# Patient Record
Sex: Female | Born: 1975 | Race: White | Hispanic: No | Marital: Single | Smoking: Current every day smoker
Health system: Southern US, Community
[De-identification: ages and names within clinical notes are randomized; demographics above are authoritative.]

## PROBLEM LIST (undated history)

## (undated) DIAGNOSIS — IMO0002 Reserved for concepts with insufficient information to code with codable children: Secondary | ICD-10-CM

## (undated) DIAGNOSIS — C569 Malignant neoplasm of unspecified ovary: Secondary | ICD-10-CM

## (undated) DIAGNOSIS — R011 Cardiac murmur, unspecified: Secondary | ICD-10-CM

## (undated) DIAGNOSIS — F419 Anxiety disorder, unspecified: Secondary | ICD-10-CM

## (undated) DIAGNOSIS — Z8719 Personal history of other diseases of the digestive system: Secondary | ICD-10-CM

## (undated) DIAGNOSIS — N83209 Unspecified ovarian cyst, unspecified side: Secondary | ICD-10-CM

## (undated) DIAGNOSIS — A749 Chlamydial infection, unspecified: Secondary | ICD-10-CM

## (undated) HISTORY — DX: Chlamydial infection, unspecified: A74.9

## (undated) HISTORY — PX: SACROILIAC JOINT FUSION: SHX6088

## (undated) HISTORY — DX: Malignant neoplasm of unspecified ovary: C56.9

## (undated) HISTORY — PX: BACK SURGERY: SHX140

---

## 2006-12-05 ENCOUNTER — Ambulatory Visit: Payer: Self-pay

## 2007-01-06 ENCOUNTER — Observation Stay: Payer: Self-pay | Admitting: Obstetrics & Gynecology

## 2007-01-23 ENCOUNTER — Inpatient Hospital Stay: Payer: Self-pay | Admitting: Obstetrics & Gynecology

## 2007-04-13 ENCOUNTER — Ambulatory Visit: Payer: Self-pay

## 2008-05-15 ENCOUNTER — Ambulatory Visit: Payer: Self-pay | Admitting: Obstetrics & Gynecology

## 2008-05-17 ENCOUNTER — Ambulatory Visit (HOSPITAL_COMMUNITY): Admission: RE | Admit: 2008-05-17 | Discharge: 2008-05-17 | Payer: Self-pay | Admitting: Gynecology

## 2008-06-12 ENCOUNTER — Ambulatory Visit: Payer: Self-pay | Admitting: Obstetrics and Gynecology

## 2008-06-12 ENCOUNTER — Encounter: Payer: Self-pay | Admitting: Obstetrics and Gynecology

## 2008-07-03 ENCOUNTER — Ambulatory Visit (HOSPITAL_COMMUNITY): Admission: RE | Admit: 2008-07-03 | Discharge: 2008-07-03 | Payer: Self-pay | Admitting: Obstetrics & Gynecology

## 2008-07-24 ENCOUNTER — Ambulatory Visit: Payer: Self-pay | Admitting: Obstetrics & Gynecology

## 2008-08-03 ENCOUNTER — Emergency Department: Payer: Self-pay | Admitting: Emergency Medicine

## 2008-08-20 ENCOUNTER — Ambulatory Visit: Payer: Self-pay | Admitting: Obstetrics and Gynecology

## 2008-08-21 ENCOUNTER — Ambulatory Visit (HOSPITAL_COMMUNITY): Admission: RE | Admit: 2008-08-21 | Discharge: 2008-08-21 | Payer: Self-pay | Admitting: Obstetrics & Gynecology

## 2008-09-19 ENCOUNTER — Ambulatory Visit: Payer: Self-pay | Admitting: Obstetrics and Gynecology

## 2008-09-23 ENCOUNTER — Encounter: Payer: Self-pay | Admitting: Obstetrics & Gynecology

## 2008-09-23 ENCOUNTER — Ambulatory Visit: Payer: Self-pay | Admitting: Obstetrics and Gynecology

## 2008-09-23 LAB — CONVERTED CEMR LAB
HCT: 32.1 % — ABNORMAL LOW (ref 36.0–46.0)
MCV: 90.9 fL (ref 78.0–100.0)
RBC: 3.53 M/uL — ABNORMAL LOW (ref 3.87–5.11)
RDW: 13 % (ref 11.5–15.5)
WBC: 9.8 10*3/uL (ref 4.0–10.5)

## 2008-10-02 ENCOUNTER — Ambulatory Visit: Payer: Self-pay | Admitting: Obstetrics and Gynecology

## 2008-10-03 ENCOUNTER — Encounter: Payer: Self-pay | Admitting: Obstetrics & Gynecology

## 2008-10-03 LAB — CONVERTED CEMR LAB
Clue Cells Wet Prep HPF POC: NONE SEEN
Trich, Wet Prep: NONE SEEN

## 2008-10-07 ENCOUNTER — Ambulatory Visit: Payer: Self-pay | Admitting: Family Medicine

## 2008-10-07 ENCOUNTER — Inpatient Hospital Stay (HOSPITAL_COMMUNITY): Admission: AD | Admit: 2008-10-07 | Discharge: 2008-10-10 | Payer: Self-pay | Admitting: Obstetrics & Gynecology

## 2008-10-16 ENCOUNTER — Ambulatory Visit: Payer: Self-pay | Admitting: Family Medicine

## 2008-10-23 ENCOUNTER — Ambulatory Visit: Payer: Self-pay | Admitting: Obstetrics & Gynecology

## 2008-10-30 ENCOUNTER — Ambulatory Visit: Payer: Self-pay | Admitting: Family Medicine

## 2008-11-07 ENCOUNTER — Ambulatory Visit: Payer: Self-pay | Admitting: Obstetrics and Gynecology

## 2008-11-13 ENCOUNTER — Ambulatory Visit: Payer: Self-pay | Admitting: Obstetrics and Gynecology

## 2008-11-20 ENCOUNTER — Ambulatory Visit: Payer: Self-pay | Admitting: Obstetrics & Gynecology

## 2008-11-27 ENCOUNTER — Ambulatory Visit: Payer: Self-pay | Admitting: Obstetrics & Gynecology

## 2008-12-03 ENCOUNTER — Ambulatory Visit: Payer: Self-pay | Admitting: Obstetrics and Gynecology

## 2008-12-04 ENCOUNTER — Inpatient Hospital Stay (HOSPITAL_COMMUNITY): Admission: AD | Admit: 2008-12-04 | Discharge: 2008-12-06 | Payer: Self-pay | Admitting: Family Medicine

## 2008-12-04 ENCOUNTER — Ambulatory Visit: Payer: Self-pay | Admitting: Obstetrics & Gynecology

## 2008-12-30 ENCOUNTER — Ambulatory Visit: Payer: Self-pay | Admitting: Obstetrics & Gynecology

## 2008-12-30 ENCOUNTER — Encounter: Payer: Self-pay | Admitting: Obstetrics & Gynecology

## 2008-12-30 LAB — CONVERTED CEMR LAB: TSH: 0.551 microintl units/mL (ref 0.350–4.500)

## 2010-04-17 ENCOUNTER — Emergency Department: Payer: Self-pay | Admitting: Emergency Medicine

## 2010-09-10 ENCOUNTER — Inpatient Hospital Stay: Payer: Self-pay | Admitting: Psychiatry

## 2010-11-27 ENCOUNTER — Emergency Department: Payer: Self-pay | Admitting: Emergency Medicine

## 2010-12-16 LAB — CBC
HCT: 21.2 % — ABNORMAL LOW (ref 36.0–46.0)
Platelets: 231 10*3/uL (ref 150–400)

## 2010-12-17 LAB — CBC
Hemoglobin: 9.7 g/dL — ABNORMAL LOW (ref 12.0–15.0)
MCHC: 32.6 g/dL (ref 30.0–36.0)
MCV: 82 fL (ref 78.0–100.0)
Platelets: 305 10*3/uL (ref 150–400)
RBC: 3.63 MIL/uL — ABNORMAL LOW (ref 3.87–5.11)
RDW: 14.3 % (ref 11.5–15.5)
WBC: 14.1 10*3/uL — ABNORMAL HIGH (ref 4.0–10.5)

## 2010-12-17 LAB — RPR: RPR Ser Ql: NONREACTIVE

## 2010-12-22 LAB — CROSSMATCH: Antibody Screen: NEGATIVE

## 2010-12-22 LAB — CBC
HCT: 31.8 % — ABNORMAL LOW (ref 36.0–46.0)
Hemoglobin: 10.8 g/dL — ABNORMAL LOW (ref 12.0–15.0)
MCHC: 33.2 g/dL (ref 30.0–36.0)
MCV: 87.1 fL (ref 78.0–100.0)
Platelets: 237 10*3/uL (ref 150–400)
RBC: 3.48 MIL/uL — ABNORMAL LOW (ref 3.87–5.11)

## 2010-12-22 LAB — APTT: aPTT: 24 seconds (ref 24–37)

## 2010-12-22 LAB — PROTIME-INR: Prothrombin Time: 13.2 seconds (ref 11.6–15.2)

## 2010-12-22 LAB — WET PREP, GENITAL: Yeast Wet Prep HPF POC: NONE SEEN

## 2011-01-19 NOTE — Assessment & Plan Note (Signed)
NAME:  Margaret Washington, Margaret Washington NO.:  [id number]   MEDICAL RECORD NO.:  [id number]          PATIENT TYPE:  POB   LOCATION:  [facility] at [facility]         FACILITY:  [facility]   PHYSICIAN:  Allie Bossier, MD        DATE OF BIRTH:  October 18, 1975   DATE OF SERVICE:  12/30/2008                                  CLINIC NOTE   Ms. Puerto is a 35 year old married white, gravida 4, para 4, who is 3  weeks status post delivery, December 04, 2008.  She complains a feeling of  sadness and depression.  She says she has not bonded with her baby at  all and considers this abnormal.  She says that even in feeding him  (breast milk).  She feels it is a Therapist, nutritional.  She adamantly denies  that she would hurt herself or any of her children or anyone else, but  she does complain of being numb and angry.  I talked to her about  postpartum depression.  We will check a thyroid although it was checked  and normal apparently 4 months ago.  I talked about treating her with  SSRI and told her that it would take approximately 2 weeks to work.  She  will request Valium.  She says that her doctor, Dr. Timoteo Gaul, who took  care of her for her sciatic pain, had given her Valium during this  pregnancy (there is no mention of that during any of her visits).  I  have agreed to give her 30 Valium, but I have told her that on  absolutely no way will I refill that.  In the meantime, I had given her  a prescription for Celexa 10 mg to be taken at night.  I have explained  that finding the perfect SSRIs at a trial and error situation and should  this not work for her then my next step would be to try Prozac.  I have  suggested that she may want to quit breastfeeding since she considers  this a chore and that it might decrease stress on her; however, she  wishes to continue breastfeeding.  I did discuss that these drugs are  category C.  She will come back in 2-3 weeks to let me know how this is  going or any time sooner that she  needs.       Allie Bossier, MD     MCD/MEDQ  D:  12/30/2008  T:  12/31/2008  Job:  [id number]

## 2011-01-19 NOTE — Discharge Summary (Signed)
NAMEROSEALIE, REACH NO.:  [id number]   MEDICAL RECORD NO.:  [id number]          PATIENT TYPE:  INP   LOCATION:  9149                          FACILITY:  [facility]   PHYSICIAN:  Allie Bossier, MD        DATE OF BIRTH:  1975/10/23   DATE OF ADMISSION:  10/07/2008  DATE OF DISCHARGE:  10/10/2008                               DISCHARGE SUMMARY   DISCHARGE DIAGNOSES:  1. Vaginal bleeding likely secondary to marginal placental abruption.  2. Intrauterine pregnancy at 32 weeks.   REASON FOR ADMISSION:  Ms. Terianne Thaker is a 35 year old gravida 4, para  3-0-0-3 who presented to the MAU on the evening of October 07, 2008,  with several episodes of postcoital bleeding.  However, her description  of bleeding described a moderate blood not really consistent with the  standard postcoital bleeding.   On exam in the MAU, a small amount of scant blood was noted at the  cervical os.  She had some contractions initially and an ultrasound  revealed no evidence of placenta previa or abruption.  The patient was  admitted for further observation.   HOSPITAL COURSE:  During her hospitalization, initially there were some  contractions noted on the monitor, which the patient felt.  However,  these improved on their own without any intervention.  The patient was  given 2 shots of betamethasone 12 mg IM during her hospital stay.  She  had another episode of bleeding at midnight on February 1 and since then  has had no further episodes of bleeding.  At this point, it has been  over 48 hours with no bleeding.  The patient has no contractions noted  and her fetal heart tracing shows a baseline rate of 140 with 15 x 15  accelerations, moderate variability, and no decelerations.  The patient  has some pain on her right side, which radiates to the back, which was  present before her admission, she has been treating herself with  Percocet and Valium during this pregnancy.  The pain at this point  is  unchanged from previously.   MEDICATIONS FOR DISCHARGE:  There are no new medications at the time of  discharge.   The patient can follow up at her clinic in [facility] for further  management of her back pain and the Darvocet and diazepam.   INSTRUCTIONS FOR PATIENT:  The patient was instructed to be on modified  bedrest, which includes no prolonged standing or walking or lifting  repeatedly greater than 20 pounds.  The patient voiced understanding of  these instructions and her questions were answered as well as her  husband's.  Additionally, the patient will follow up in [facility]  early next week for routine OB care and further followup.      Odie Sera, DO      Allie Bossier, MD  Electronically Signed    MC/MEDQ  D:  10/10/2008  T:  10/10/2008  Job:  [id number]

## 2011-05-31 ENCOUNTER — Emergency Department: Payer: Self-pay | Admitting: Emergency Medicine

## 2011-06-26 ENCOUNTER — Emergency Department (HOSPITAL_COMMUNITY)
Admission: EM | Admit: 2011-06-26 | Discharge: 2011-06-27 | Disposition: A | Discharge status: Home / Self Care | Payer: No Typology Code available for payment source | Attending: Emergency Medicine | Admitting: Emergency Medicine

## 2011-06-26 DIAGNOSIS — M545 Low back pain, unspecified: Secondary | ICD-10-CM | POA: Insufficient documentation

## 2011-06-26 DIAGNOSIS — R5383 Other fatigue: Secondary | ICD-10-CM | POA: Insufficient documentation

## 2011-06-26 DIAGNOSIS — R5381 Other malaise: Secondary | ICD-10-CM | POA: Insufficient documentation

## 2011-06-26 DIAGNOSIS — M542 Cervicalgia: Secondary | ICD-10-CM | POA: Insufficient documentation

## 2011-06-26 DIAGNOSIS — F411 Generalized anxiety disorder: Secondary | ICD-10-CM | POA: Insufficient documentation

## 2011-06-26 DIAGNOSIS — M79609 Pain in unspecified limb: Secondary | ICD-10-CM | POA: Insufficient documentation

## 2011-06-26 DIAGNOSIS — R209 Unspecified disturbances of skin sensation: Secondary | ICD-10-CM | POA: Insufficient documentation

## 2011-06-27 ENCOUNTER — Emergency Department (HOSPITAL_COMMUNITY): Payer: No Typology Code available for payment source

## 2011-06-27 LAB — CBC
HCT: 34.7 % — ABNORMAL LOW (ref 36.0–46.0)
MCH: 29.8 pg (ref 26.0–34.0)
MCHC: 34 g/dL (ref 30.0–36.0)
MCV: 87.6 fL (ref 78.0–100.0)
Platelets: 253 10*3/uL (ref 150–400)
RBC: 3.96 MIL/uL (ref 3.87–5.11)
RDW: 13.1 % (ref 11.5–15.5)
WBC: 11.2 10*3/uL — ABNORMAL HIGH (ref 4.0–10.5)

## 2011-06-27 LAB — BASIC METABOLIC PANEL
BUN: 11 mg/dL (ref 6–23)
Chloride: 99 mEq/L (ref 96–112)
Creatinine, Ser: 0.58 mg/dL (ref 0.50–1.10)
GFR calc Af Amer: >90 mL/min (ref >90)

## 2012-01-06 ENCOUNTER — Other Ambulatory Visit: Payer: Self-pay | Admitting: Orthopedic Surgery

## 2012-01-13 ENCOUNTER — Encounter (HOSPITAL_COMMUNITY): Payer: Self-pay | Admitting: Pharmacy Technician

## 2012-01-19 ENCOUNTER — Encounter (HOSPITAL_COMMUNITY): Payer: Self-pay

## 2012-01-19 ENCOUNTER — Ambulatory Visit (HOSPITAL_COMMUNITY)
Admission: RE | Admit: 2012-01-19 | Discharge: 2012-01-19 | Disposition: A | Discharge status: Home / Self Care | Payer: No Typology Code available for payment source | Source: Ambulatory Visit | Attending: Orthopedic Surgery | Admitting: Orthopedic Surgery

## 2012-01-19 ENCOUNTER — Encounter (HOSPITAL_COMMUNITY)
Admission: RE | Admit: 2012-01-19 | Discharge: 2012-01-19 | Disposition: A | Discharge status: Home / Self Care | Payer: No Typology Code available for payment source | Source: Ambulatory Visit | Attending: Orthopedic Surgery | Admitting: Orthopedic Surgery

## 2012-01-19 DIAGNOSIS — Z01818 Encounter for other preprocedural examination: Secondary | ICD-10-CM | POA: Insufficient documentation

## 2012-01-19 DIAGNOSIS — Z01812 Encounter for preprocedural laboratory examination: Secondary | ICD-10-CM | POA: Insufficient documentation

## 2012-01-19 DIAGNOSIS — M4120 Other idiopathic scoliosis, site unspecified: Secondary | ICD-10-CM | POA: Insufficient documentation

## 2012-01-19 HISTORY — DX: Personal history of other diseases of the digestive system: Z87.19

## 2012-01-19 HISTORY — DX: Anxiety disorder, unspecified: F41.9

## 2012-01-19 HISTORY — DX: Reserved for concepts with insufficient information to code with codable children: IMO0002

## 2012-01-19 HISTORY — DX: Cardiac murmur, unspecified: R01.1

## 2012-01-19 LAB — CBC
MCH: 31.4 pg (ref 26.0–34.0)
MCHC: 33 g/dL (ref 30.0–36.0)
MCV: 95.3 fL (ref 78.0–100.0)
Platelets: 282 10*3/uL (ref 150–400)
RDW: 13.2 % (ref 11.5–15.5)

## 2012-01-19 LAB — PROTIME-INR: Prothrombin Time: 14.1 seconds (ref 11.6–15.2)

## 2012-01-19 LAB — ABO/RH: ABO/RH(D): A POS

## 2012-01-19 LAB — COMPREHENSIVE METABOLIC PANEL
Alkaline Phosphatase: 35 U/L — ABNORMAL LOW (ref 39–117)
BUN: 5 mg/dL — ABNORMAL LOW (ref 6–23)
Chloride: 102 mEq/L (ref 96–112)
GFR calc Af Amer: >90 mL/min (ref >90)
GFR calc non Af Amer: >90 mL/min (ref >90)
Glucose, Bld: 85 mg/dL (ref 70–99)
Potassium: 3.7 mEq/L (ref 3.5–5.1)
Total Bilirubin: 0.2 mg/dL — ABNORMAL LOW (ref 0.3–1.2)
Total Protein: 7.1 g/dL (ref 6.0–8.3)

## 2012-01-19 LAB — DIFFERENTIAL
Basophils Absolute: 0 10*3/uL (ref 0.0–0.1)
Eosinophils Absolute: 0.2 10*3/uL (ref 0.0–0.7)
Eosinophils Relative: 2 % (ref 0–5)

## 2012-01-19 LAB — URINALYSIS, ROUTINE W REFLEX MICROSCOPIC
Glucose, UA: NEGATIVE mg/dL
Hgb urine dipstick: NEGATIVE
Ketones, ur: NEGATIVE mg/dL
Protein, ur: NEGATIVE mg/dL
pH: 7 (ref 5.0–8.0)

## 2012-01-19 LAB — TYPE AND SCREEN: ABO/RH(D): A POS

## 2012-01-19 LAB — HCG, SERUM, QUALITATIVE: Preg, Serum: NEGATIVE

## 2012-01-19 NOTE — Pre-Procedure Instructions (Signed)
Margaret Washington  01/19/2012   Your procedure is scheduled on:  **Wednesday, May 22*  Report to [facility] at *0630** AM.  Call this number if you have problems the morning of surgery: [phone]   Remember:   Do not eat food:After Midnight.  May have clear liquids: up to 4 Hours before arrival.( 2:00 am)  Clear liquids include soda, tea, black coffee, apple or grape juice, broth.  Take these medicines the morning of surgery with A SIP OF WATER: *Xanax, Hydrocodone,**   Do not wear jewelry, make-up or nail polish.  Do not wear lotions, powders, or perfumes. You may wear deodorant.  Do not shave 48 hours prior to surgery. Men may shave face and neck.  Do not bring valuables to the hospital.  Contacts, dentures or bridgework may not be worn into surgery.  Leave suitcase in the car. After surgery it may be brought to your room.  For patients admitted to the hospital, checkout time is 11:00 AM the day of discharge.   Patients discharged the day of surgery will not be allowed to drive home.  Name and phone number of your driver: *n/a**  Special Instructions: Incentive Spirometry - Practice and bring it with you on the day of surgery. and CHG Shower Use Special Wash: 1/2 bottle night before surgery and 1/2 bottle morning of surgery.   Please read over the following fact sheets that you were given: Pain Booklet, Coughing and Deep Breathing, Blood Transfusion Information, MRSA Information and Surgical Site Infection Prevention

## 2012-01-25 MED ORDER — CEFAZOLIN SODIUM-DEXTROSE 2-3 GM-% IV SOLR
2.0000 g | INTRAVENOUS | Status: AC
  Administered 2012-01-26: 2 g via INTRAVENOUS
  Filled 2012-01-25: qty 50

## 2012-01-26 ENCOUNTER — Ambulatory Visit (HOSPITAL_COMMUNITY): Payer: No Typology Code available for payment source

## 2012-01-26 ENCOUNTER — Encounter (HOSPITAL_COMMUNITY): Payer: Self-pay | Admitting: Anesthesiology

## 2012-01-26 ENCOUNTER — Inpatient Hospital Stay (HOSPITAL_COMMUNITY)
Admission: RE | Admit: 2012-01-26 | Discharge: 2012-01-27 | DRG: 460 | Disposition: A | Discharge status: Home / Self Care | Payer: No Typology Code available for payment source | Source: Ambulatory Visit | Attending: Orthopedic Surgery | Admitting: Orthopedic Surgery

## 2012-01-26 ENCOUNTER — Encounter (HOSPITAL_COMMUNITY)
Admission: RE | Disposition: A | Discharge status: Home / Self Care | Payer: Self-pay | Source: Ambulatory Visit | Attending: Orthopedic Surgery

## 2012-01-26 ENCOUNTER — Ambulatory Visit (HOSPITAL_COMMUNITY): Payer: No Typology Code available for payment source | Admitting: Anesthesiology

## 2012-01-26 DIAGNOSIS — F411 Generalized anxiety disorder: Secondary | ICD-10-CM | POA: Diagnosis present

## 2012-01-26 DIAGNOSIS — Z01810 Encounter for preprocedural cardiovascular examination: Secondary | ICD-10-CM

## 2012-01-26 DIAGNOSIS — M533 Sacrococcygeal disorders, not elsewhere classified: Principal | ICD-10-CM | POA: Diagnosis present

## 2012-01-26 DIAGNOSIS — Z01818 Encounter for other preprocedural examination: Secondary | ICD-10-CM

## 2012-01-26 DIAGNOSIS — F17200 Nicotine dependence, unspecified, uncomplicated: Secondary | ICD-10-CM | POA: Diagnosis present

## 2012-01-26 LAB — URINALYSIS, ROUTINE W REFLEX MICROSCOPIC
Bilirubin Urine: NEGATIVE
Glucose, UA: NEGATIVE mg/dL
Hgb urine dipstick: NEGATIVE
Ketones, ur: NEGATIVE mg/dL
Leukocytes, UA: NEGATIVE
Nitrite: NEGATIVE
Protein, ur: NEGATIVE mg/dL
Specific Gravity, Urine: 1.022 (ref 1.005–1.030)
Urobilinogen, UA: 0.2 mg/dL (ref 0.0–1.0)
pH: 6.5 (ref 5.0–8.0)

## 2012-01-26 SURGERY — SACROILIAC JOINT FUSION
Anesthesia: General | Site: Pelvis | Laterality: Right | Wound class: Clean

## 2012-01-26 MED ORDER — ALPRAZOLAM 1 MG PO TABS: 1.0000 mg | ORAL_TABLET | Freq: Three times a day (TID) | ORAL | Status: DC | PRN

## 2012-01-26 MED ORDER — DOCUSATE SODIUM 100 MG PO CAPS
100.0000 mg | ORAL_CAPSULE | Freq: Two times a day (BID) | ORAL | Status: DC
  Administered 2012-01-26 – 2012-01-27 (×2): 100 mg via ORAL
  Filled 2012-01-26 (×2): qty 1

## 2012-01-26 MED ORDER — ROCURONIUM BROMIDE 100 MG/10ML IV SOLN
INTRAVENOUS | Status: DC | PRN
  Administered 2012-01-26: 50 mg via INTRAVENOUS

## 2012-01-26 MED ORDER — ONDANSETRON HCL 4 MG/2ML IJ SOLN
INTRAMUSCULAR | Status: DC | PRN
  Administered 2012-01-26: 4 mg via INTRAVENOUS

## 2012-01-26 MED ORDER — SODIUM CHLORIDE 0.9 % IJ SOLN
3.0000 mL | Freq: Two times a day (BID) | INTRAMUSCULAR | Status: DC
  Administered 2012-01-26 – 2012-01-27 (×2): 3 mL via INTRAVENOUS

## 2012-01-26 MED ORDER — SENNA 8.6 MG PO TABS
1.0000 | ORAL_TABLET | Freq: Two times a day (BID) | ORAL | Status: DC
  Administered 2012-01-26 – 2012-01-27 (×2): 8.6 mg via ORAL
  Filled 2012-01-26 (×3): qty 1

## 2012-01-26 MED ORDER — ALPRAZOLAM 0.5 MG PO TABS: 1.0000 mg | ORAL_TABLET | ORAL | Status: DC

## 2012-01-26 MED ORDER — MORPHINE SULFATE 2 MG/ML IJ SOLN
2.0000 mg | INTRAMUSCULAR | Status: DC | PRN
  Administered 2012-01-26: 2 mg via INTRAVENOUS
  Filled 2012-01-26: qty 1

## 2012-01-26 MED ORDER — BUPIVACAINE-EPINEPHRINE PF 0.25-1:200000 % IJ SOLN
INTRAMUSCULAR | Status: DC | PRN
  Administered 2012-01-26: 10 mL
  Administered 2012-01-26: 6 mL

## 2012-01-26 MED ORDER — HYDROMORPHONE HCL PF 1 MG/ML IJ SOLN
0.2500 mg | INTRAMUSCULAR | Status: DC | PRN
  Administered 2012-01-26 (×6): 0.5 mg via INTRAVENOUS

## 2012-01-26 MED ORDER — MIDAZOLAM HCL 2 MG/2ML IJ SOLN
INTRAMUSCULAR | Status: AC
  Administered 2012-01-26: 2 mg
  Filled 2012-01-26: qty 2

## 2012-01-26 MED ORDER — PHENOL 1.4 % MT LIQD: 1.0000 | OROMUCOSAL | Status: DC | PRN

## 2012-01-26 MED ORDER — MIDAZOLAM HCL 5 MG/5ML IJ SOLN
INTRAMUSCULAR | Status: DC | PRN
  Administered 2012-01-26: 2 mg via INTRAVENOUS

## 2012-01-26 MED ORDER — HYDROMORPHONE HCL PF 1 MG/ML IJ SOLN
0.5000 mg | INTRAMUSCULAR | Status: DC | PRN
  Administered 2012-01-26: 1 mg via INTRAVENOUS
  Administered 2012-01-26: 0.5 mg via INTRAVENOUS
  Administered 2012-01-27 (×3): 1 mg via INTRAVENOUS
  Filled 2012-01-26 (×5): qty 1

## 2012-01-26 MED ORDER — LACTATED RINGERS IV SOLN
INTRAVENOUS | Status: DC | PRN
  Administered 2012-01-26 (×2): via INTRAVENOUS

## 2012-01-26 MED ORDER — ALUM & MAG HYDROXIDE-SIMETH 200-200-20 MG/5ML PO SUSP: 30.0000 mL | Freq: Four times a day (QID) | ORAL | Status: DC | PRN

## 2012-01-26 MED ORDER — CALCIUM CARBONATE ANTACID 500 MG PO CHEW
1.0000 | CHEWABLE_TABLET | Freq: Three times a day (TID) | ORAL | Status: DC | PRN
  Filled 2012-01-26: qty 2

## 2012-01-26 MED ORDER — PROMETHAZINE HCL 25 MG/ML IJ SOLN
INTRAMUSCULAR | Status: AC
  Administered 2012-01-26: 12.5 mg
  Filled 2012-01-26: qty 1

## 2012-01-26 MED ORDER — ACETAMINOPHEN 325 MG PO TABS: 650.0000 mg | ORAL_TABLET | ORAL | Status: DC | PRN

## 2012-01-26 MED ORDER — LIDOCAINE HCL (CARDIAC) 20 MG/ML IV SOLN
INTRAVENOUS | Status: DC | PRN
  Administered 2012-01-26: 80 mg via INTRAVENOUS

## 2012-01-26 MED ORDER — POVIDONE-IODINE 7.5 % EX SOLN
Freq: Once | CUTANEOUS | Status: DC
  Filled 2012-01-26: qty 118

## 2012-01-26 MED ORDER — MENTHOL 3 MG MT LOZG: 1.0000 | LOZENGE | OROMUCOSAL | Status: DC | PRN

## 2012-01-26 MED ORDER — DIAZEPAM 5 MG PO TABS
5.0000 mg | ORAL_TABLET | Freq: Four times a day (QID) | ORAL | Status: DC | PRN
  Administered 2012-01-26 – 2012-01-27 (×3): 5 mg via ORAL
  Filled 2012-01-26 (×3): qty 1

## 2012-01-26 MED ORDER — ONDANSETRON HCL 4 MG/2ML IJ SOLN
INTRAMUSCULAR | Status: AC
  Filled 2012-01-26: qty 2

## 2012-01-26 MED ORDER — ONDANSETRON HCL 4 MG/2ML IJ SOLN
4.0000 mg | Freq: Four times a day (QID) | INTRAMUSCULAR | Status: AC | PRN
  Administered 2012-01-26: 4 mg via INTRAVENOUS

## 2012-01-26 MED ORDER — CEFAZOLIN SODIUM 1-5 GM-% IV SOLN
1.0000 g | Freq: Three times a day (TID) | INTRAVENOUS | Status: AC
  Administered 2012-01-26 (×2): 1 g via INTRAVENOUS
  Filled 2012-01-26 (×2): qty 50

## 2012-01-26 MED ORDER — EPHEDRINE SULFATE 50 MG/ML IJ SOLN
INTRAMUSCULAR | Status: DC | PRN
  Administered 2012-01-26: 10 mg via INTRAVENOUS

## 2012-01-26 MED ORDER — ONDANSETRON HCL 4 MG/2ML IJ SOLN: 4.0000 mg | INTRAMUSCULAR | Status: DC | PRN

## 2012-01-26 MED ORDER — ZOLPIDEM TARTRATE 5 MG PO TABS: 5.0000 mg | ORAL_TABLET | Freq: Every evening | ORAL | Status: DC | PRN

## 2012-01-26 MED ORDER — HYDROMORPHONE HCL PF 1 MG/ML IJ SOLN
INTRAMUSCULAR | Status: AC
  Filled 2012-01-26: qty 2

## 2012-01-26 MED ORDER — ACETAMINOPHEN 650 MG RE SUPP: 650.0000 mg | RECTAL | Status: DC | PRN

## 2012-01-26 MED ORDER — 0.9 % SODIUM CHLORIDE (POUR BTL) OPTIME
TOPICAL | Status: DC | PRN
  Administered 2012-01-26: 1000 mL

## 2012-01-26 MED ORDER — PROPOFOL 10 MG/ML IV EMUL
INTRAVENOUS | Status: DC | PRN
  Administered 2012-01-26: 180 mg via INTRAVENOUS

## 2012-01-26 MED ORDER — OXYCODONE-ACETAMINOPHEN 5-325 MG PO TABS
1.0000 | ORAL_TABLET | ORAL | Status: DC | PRN
  Administered 2012-01-26 – 2012-01-27 (×5): 2 via ORAL
  Filled 2012-01-26 (×5): qty 2

## 2012-01-26 MED ORDER — ALPRAZOLAM 0.5 MG PO TABS
1.0000 mg | ORAL_TABLET | Freq: Three times a day (TID) | ORAL | Status: DC | PRN
  Administered 2012-01-26: 1 mg via ORAL

## 2012-01-26 MED ORDER — SODIUM CHLORIDE 0.9 % IJ SOLN: 3.0000 mL | INTRAMUSCULAR | Status: DC | PRN

## 2012-01-26 MED ORDER — FENTANYL CITRATE 0.05 MG/ML IJ SOLN
INTRAMUSCULAR | Status: DC | PRN
  Administered 2012-01-26 (×2): 100 ug via INTRAVENOUS
  Administered 2012-01-26: 50 ug via INTRAVENOUS

## 2012-01-26 SURGICAL SUPPLY — 54 items
BAG DECANTER FOR FLEXI CONT (MISCELLANEOUS) IMPLANT
BENZOIN TINCTURE PRP APPL 2/3 (GAUZE/BANDAGES/DRESSINGS) ×2 IMPLANT
BLADE SURG 10 STRL SS (BLADE) ×2 IMPLANT
BLADE SURG 11 STRL SS (BLADE) ×2 IMPLANT
BLADE SURG ROTATE 9660 (MISCELLANEOUS) IMPLANT
CANISTER SUCTION 2500CC (MISCELLANEOUS) IMPLANT
CLOTH BEACON ORANGE TIMEOUT ST (SAFETY) ×2 IMPLANT
COVER MAYO STAND STRL (DRAPES) ×2 IMPLANT
COVER SURGICAL LIGHT HANDLE (MISCELLANEOUS) ×2 IMPLANT
DRAPE C-ARM 42X72 X-RAY (DRAPES) ×2 IMPLANT
DRAPE INCISE IOBAN 66X45 STRL (DRAPES) ×2 IMPLANT
DRAPE ORTHO SPLIT 77X108 STRL (DRAPES) ×1
DRAPE POUCH INSTRU U-SHP 10X18 (DRAPES) ×2 IMPLANT
DRAPE SURG 17X23 STRL (DRAPES) ×8 IMPLANT
DRAPE SURG ORHT 6 SPLT 77X108 (DRAPES) ×1 IMPLANT
DURAPREP 26ML APPLICATOR (WOUND CARE) ×2 IMPLANT
ELECT CAUTERY BLADE 6.4 (BLADE) ×2 IMPLANT
ELECT REM PT RETURN 9FT ADLT (ELECTROSURGICAL) ×2
ELECTRODE REM PT RTRN 9FT ADLT (ELECTROSURGICAL) ×1 IMPLANT
GAUZE SPONGE 4X4 16PLY XRAY LF (GAUZE/BANDAGES/DRESSINGS) ×2 IMPLANT
GLOVE BIO SURGEON STRL SZ8 (GLOVE) ×4 IMPLANT
GLOVE BIOGEL PI IND STRL 7.5 (GLOVE) ×1 IMPLANT
GLOVE BIOGEL PI IND STRL 8 (GLOVE) ×1 IMPLANT
GLOVE BIOGEL PI INDICATOR 7.5 (GLOVE) ×1
GLOVE BIOGEL PI INDICATOR 8 (GLOVE) ×1
GLOVE SURG SS PI 7.5 STRL IVOR (GLOVE) ×2 IMPLANT
GOWN STRL NON-REIN LRG LVL3 (GOWN DISPOSABLE) IMPLANT
GOWN STRL REIN XL XLG (GOWN DISPOSABLE) ×4 IMPLANT
KIT BASIN OR (CUSTOM PROCEDURE TRAY) ×2 IMPLANT
KIT ROOM TURNOVER OR (KITS) ×2 IMPLANT
MANIFOLD NEPTUNE II (INSTRUMENTS) ×4 IMPLANT
NEEDLE 22X1 1/2 (OR ONLY) (NEEDLE) ×2 IMPLANT
NEEDLE HYPO 25GX1X1/2 BEV (NEEDLE) ×2 IMPLANT
NS IRRIG 1000ML POUR BTL (IV SOLUTION) ×2 IMPLANT
PACK SURGICAL SETUP 50X90 (CUSTOM PROCEDURE TRAY) IMPLANT
PACK UNIVERSAL I (CUSTOM PROCEDURE TRAY) ×4 IMPLANT
PAD ARMBOARD 7.5X6 YLW CONV (MISCELLANEOUS) ×2 IMPLANT
PENCIL BUTTON HOLSTER BLD 10FT (ELECTRODE) ×2 IMPLANT
SPONGE GAUZE 4X4 12PLY (GAUZE/BANDAGES/DRESSINGS) ×2 IMPLANT
SPONGE LAP 18X18 X RAY DECT (DISPOSABLE) ×2 IMPLANT
STAPLER VISISTAT 35W (STAPLE) ×2 IMPLANT
STRIP CLOSURE SKIN 1/2X4 (GAUZE/BANDAGES/DRESSINGS) ×2 IMPLANT
SUT MNCRL AB 4-0 PS2 18 (SUTURE) ×2 IMPLANT
SUT VIC AB 0 CT1 18XCR BRD 8 (SUTURE) ×1 IMPLANT
SUT VIC AB 0 CT1 8-18 (SUTURE) ×1
SUT VIC AB 2-0 CT2 18 VCP726D (SUTURE) ×2 IMPLANT
SYR BULB IRRIGATION 50ML (SYRINGE) ×2 IMPLANT
SYR CONTROL 10ML LL (SYRINGE) ×2 IMPLANT
TOWEL OR 17X24 6PK STRL BLUE (TOWEL DISPOSABLE) IMPLANT
TOWEL OR 17X26 10 PK STRL BLUE (TOWEL DISPOSABLE) ×4 IMPLANT
TUBE CONNECTING 12X1/4 (SUCTIONS) ×2 IMPLANT
UNDERPAD 30X30 INCONTINENT (UNDERPADS AND DIAPERS) IMPLANT
WATER STERILE IRR 1000ML POUR (IV SOLUTION) ×2 IMPLANT
YANKAUER SUCT BULB TIP NO VENT (SUCTIONS) ×2 IMPLANT

## 2012-01-26 NOTE — Anesthesia Preprocedure Evaluation (Signed)
Anesthesia Evaluation  Patient identified by MRN, date of birth, ID band Patient awake    Reviewed: Allergy & Precautions, H&P , NPO status , Patient's Chart, lab work & pertinent test results  Airway Mallampati: II  Neck ROM: full    Dental   Pulmonary          Cardiovascular     Neuro/Psych PSYCHIATRIC DISORDERS Anxiety    GI/Hepatic   Endo/Other    Renal/GU      Musculoskeletal   Abdominal   Peds  Hematology   Anesthesia Other Findings   Reproductive/Obstetrics                           Anesthesia Physical Anesthesia Plan  ASA: II  Anesthesia Plan: General   Post-op Pain Management:    Induction: Intravenous  Airway Management Planned: Oral ETT  Additional Equipment:   Intra-op Plan:   Post-operative Plan: Extubation in OR  Informed Consent: I have reviewed the patients History and Physical, chart, labs and discussed the procedure including the risks, benefits and alternatives for the proposed anesthesia with the patient or authorized representative who has indicated his/her understanding and acceptance.     Plan Discussed with: CRNA and Surgeon  Anesthesia Plan Comments:         Anesthesia Quick Evaluation

## 2012-01-26 NOTE — Progress Notes (Signed)
UR COMPLETED  

## 2012-01-26 NOTE — Anesthesia Postprocedure Evaluation (Signed)
Anesthesia Post Note  Patient: Margaret Washington  Procedure(s) Performed: Procedure(s) (LRB): SACROILIAC JOINT FUSION (Right)  Anesthesia type: General  Patient location: PACU  Post pain: Pain level controlled and Adequate analgesia  Post assessment: Post-op Vital signs reviewed, Patient's Cardiovascular Status Stable, Respiratory Function Stable, Patent Airway and Pain level controlled  Last Vitals:  Filed Vitals:   01/26/12 1145  BP: 100/63  Pulse: 52  Temp: 36.7 C  Resp: 13    Post vital signs: Reviewed and stable  Level of consciousness: awake, alert  and oriented  Complications: No apparent anesthesia complications

## 2012-01-26 NOTE — Anesthesia Procedure Notes (Signed)
Procedure Name: Intubation Date/Time: 01/26/2012 8:36 AM Performed by: Rossie Muskrat L Pre-anesthesia Checklist: Patient identified, Timeout performed, Emergency Drugs available, Suction available and Patient being monitored Patient Re-evaluated:Patient Re-evaluated prior to inductionOxygen Delivery Method: Circle system utilized Preoxygenation: Pre-oxygenation with 100% oxygen Intubation Type: IV induction Ventilation: Mask ventilation without difficulty Laryngoscope Size: Miller and 2 Grade View: Grade I Tube type: Oral Tube size: 7.5 mm Number of attempts: 1 Airway Equipment and Method: Stylet Placement Confirmation: ETT inserted through vocal cords under direct vision,  breath sounds checked- equal and bilateral and positive ETCO2 Secured at: 19 cm Tube secured with: Tape Dental Injury: Teeth and Oropharynx as per pre-operative assessment

## 2012-01-26 NOTE — Progress Notes (Signed)
PT. GIVEN 2 PERCOCET AND 1 VALIUM AT 1827 AND AT 2000, PT. RATED PAIN AT 7 AND REQUESTED SOME OTHER PAIN MEDICATION.  PT. HAD 2MG  MORPHINE AT 1651 AND SAID THIS DID NOT HELP WITH PAIN AT ALL AND ONLY GAVE HER A BAD HEADACHE.  DR. Ave Filter PAGED AND NEW ORDERS RECEIVED.                                                                                                                                    CHRIS Moncerrat Burnstein RN
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
## 2012-01-26 NOTE — Preoperative (Signed)
Beta Blockers   Reason not to administer Beta Blockers:Not Applicable 

## 2012-01-26 NOTE — Op Note (Signed)
NAMEVONNA, BRABSON NO.:  [id number]  MEDICAL RECORD NO.:  [id number]  LOCATION:  [facility]                         FACILITY:  [facility]  PHYSICIAN:  Estill Bamberg, MD      DATE OF BIRTH:  11-26-1975  DATE OF PROCEDURE:  01/26/2012 DATE OF DISCHARGE:                              OPERATIVE REPORT   PREOPERATIVE DIAGNOSIS:  Right-sided sacroiliac joint dysfunction.  POSTOPERATIVE DIAGNOSIS:  Right-sided sacroiliac joint dysfunction.  PROCEDURES: 1. Right-sided sacroiliac joint fusion. 2. Intraoperative use of fluoroscopy.  SURGEON:  Estill Bamberg, MD  ANESTHESIA:  General endotracheal anesthesia.  COMPLICATIONS:  None.  DISPOSITION:  Stable.  BLOOD LOSS:  Minimal.  INDICATIONS FOR PROCEDURE:  Briefly, Margaret Washington is a very pleasant 36 year old female, who presented to me with severe pain in the right side of her low back.  The location of her pain and her history was very much consistent with right-sided sacroiliac joint dysfunction.  The patient did have extensive forms of conservative care and did also have a right- sided sacroiliac joint injection.  The patient is very clear in stating that the right SI injection did entirely alleviate her pain completely, but only for a short period of time.  Given the patient's ongoing and debilitating pain, we did have a discussion regarding going forward with a right-sided sacroiliac joint fusion.  The patient fully understood the risks and limitations behind the procedure and did wish to go forward.  OPERATIVE DETAILS:  On Jan 26, 2012, the patient was brought to surgery and general endotracheal anesthesia was administered.  The patient was placed prone on a well-padded flat Jackson bed with Gel rolls placed under the patient's chest and hips.  Antibiotics were given, and a time- out procedure was performed.  SCDs were placed.  The right buttock was then prepped and draped in usual sterile fashion.  I then brought  in lateral fluoroscopy and did obtain a perfect lateral view.  The posterior border of the sacrum and the sacral ala was marked out.  A 3 cm incision was then made 1 cm inferior to the sacral ala.  I then placed my first guidewire across the sacroiliac joint just below the sacral ala.  The guidewire was advanced across the sacroiliac joint.  I did liberally use both inlet and outlet radiographs to confirm the appropriate trajectory of the guidewire.  I then drilled over the guidewire, and a broach was then advanced across the joint.  I then placed a 7 x 55 mm implant in the usual fashion.  I did note an excellent press fit.  A second guidewire was placed below the first. Drilling and broaching was again accomplished, and the implant was placed 7 x 40 mm in length.  In a similar manner, third implant was placed below the second.  Of note, I did liberally use both lateral inlet and outlet radiographs to confirm appropriate positioning of the implants.  I was very happy with the final appearance in the final press fit of each of the implants.  At this point, all the guidewires were removed.  The wound was copiously irrigated.  The fascia was then closed using 0 Vicryl  and the subcutaneous layer was closed using 2-0 Vicryl, and the skin was closed using 4-0 Monocryl.  10 mg of Marcaine was infiltrated about the gluteal musculature to help control postoperative pain.  All instrument counts were correct at the termination of the procedure.     Estill Bamberg, MD     MD/MEDQ  D:  01/26/2012  T:  01/26/2012  Job:  [id number]

## 2012-01-26 NOTE — Progress Notes (Signed)
Orthopedic Tech Progress Note Patient Details:  Margaret Washington 11-14-1975 [id number]  Other Ortho Devices Type of Ortho Device: Crutches Ortho Device Interventions: Application   Jorah Hua T 01/26/2012, 12:04 PM

## 2012-01-26 NOTE — Transfer of Care (Signed)
Immediate Anesthesia Transfer of Care Note  Patient: Margaret Washington  Procedure(s) Performed: Procedure(s) (LRB): SACROILIAC JOINT FUSION (Right)  Patient Location: PACU  Anesthesia Type: General  Level of Consciousness: awake, alert  and oriented  Airway & Oxygen Therapy: Patient Spontanous Breathing and Patient connected to nasal cannula oxygen  Post-op Assessment: Report given to PACU RN, Post -op Vital signs reviewed and stable and Patient moving all extremities  Post vital signs: Reviewed and stable  Complications: No apparent anesthesia complications

## 2012-01-27 NOTE — Discharge Summary (Signed)
[name], [name] NO.:  [id number]  MEDICAL RECORD NO.:  [id number]  LOCATION:  3526                         FACILITY:  [facility]  PHYSICIAN:  Estill Bamberg, MD      DATE OF BIRTH:  09/10/1975  DATE OF ADMISSION:  01/26/2012 DATE OF DISCHARGE:  01/27/2012                              DISCHARGE SUMMARY   ADMISSION DIAGNOSIS:  Right-sided sacroiliac joint dysfunction.  DISCHARGE DIAGNOSIS:  Right-sided sacroiliac joint dysfunction.  ADMITTING PHYSICIAN:  Estill Bamberg, MD  ADMISSION HISTORY:  Briefly, Ms. Walthers is a pleasant 36 year old female who initially presented to me with right sacroiliac joint pain, improved temporally with an injection.  We did make a decision to go forward with the right-sided sacroiliac joint fusion.  HOSPITAL COURSE:  On Jan 26, 2012, the patient underwent the procedure noted above.  The patient tolerated the procedure well and was transferred to recovery in stable condition.  She was ultimately transferred to the floor in stable condition.  The patient was evaluated by me in the morning of postoperative day #1.  Her pain was well controlled with oral pain medications.  She was given crutches and was uneventfully discharged home.  Of note, the patient was neurovascularly intact upon discharge.  DISCHARGE INSTRUCTIONS:  The patient will take Percocet for pain and Valium for spasms.  She will be nonweightbearing for the first 2 weeks on the right side, and she will follow up with me in approximately 2 weeks after her surgery.     Estill Bamberg, MD     MD/MEDQ  D:  01/27/2012  T:  01/27/2012  Job:  [id number]

## 2012-01-27 NOTE — Progress Notes (Signed)
Patients reports right buttock pain, improved substantially since last night.  BP 106/63  Pulse 96  Temp(Src) 99.1 F (37.3 C) (Oral)  Resp 18  SpO2 96%  LMP 01/02/2012 [facility]  Pt POD#1 after right SI fusion with expected postop pain  - percocet and valium for pain and spasms - d/c home today, f/u 2 weeks - NWB on right

## 2012-02-22 ENCOUNTER — Emergency Department: Payer: Self-pay | Admitting: Emergency Medicine

## 2012-04-12 ENCOUNTER — Ambulatory Visit: Payer: Self-pay | Admitting: Family Medicine

## 2012-05-14 ENCOUNTER — Emergency Department (HOSPITAL_COMMUNITY)
Admission: EM | Admit: 2012-05-14 | Discharge: 2012-05-15 | Disposition: A | Discharge status: Home / Self Care | Payer: BC Managed Care – PPO | Attending: Emergency Medicine | Admitting: Emergency Medicine

## 2012-05-14 ENCOUNTER — Encounter (HOSPITAL_COMMUNITY): Payer: Self-pay | Admitting: Emergency Medicine

## 2012-05-14 DIAGNOSIS — N83209 Unspecified ovarian cyst, unspecified side: Secondary | ICD-10-CM

## 2012-05-14 DIAGNOSIS — N949 Unspecified condition associated with female genital organs and menstrual cycle: Secondary | ICD-10-CM | POA: Insufficient documentation

## 2012-05-14 DIAGNOSIS — F17200 Nicotine dependence, unspecified, uncomplicated: Secondary | ICD-10-CM | POA: Insufficient documentation

## 2012-05-14 DIAGNOSIS — R109 Unspecified abdominal pain: Secondary | ICD-10-CM | POA: Insufficient documentation

## 2012-05-14 DIAGNOSIS — B9689 Other specified bacterial agents as the cause of diseases classified elsewhere: Secondary | ICD-10-CM

## 2012-05-14 DIAGNOSIS — Z87898 Personal history of other specified conditions: Secondary | ICD-10-CM | POA: Insufficient documentation

## 2012-05-14 DIAGNOSIS — R112 Nausea with vomiting, unspecified: Secondary | ICD-10-CM | POA: Insufficient documentation

## 2012-05-14 DIAGNOSIS — R1031 Right lower quadrant pain: Secondary | ICD-10-CM | POA: Insufficient documentation

## 2012-05-14 HISTORY — DX: Unspecified ovarian cyst, unspecified side: N83.209

## 2012-05-14 LAB — URINALYSIS, ROUTINE W REFLEX MICROSCOPIC
Glucose, UA: NEGATIVE mg/dL
Ketones, ur: NEGATIVE mg/dL
Protein, ur: NEGATIVE mg/dL

## 2012-05-14 LAB — CBC WITH DIFFERENTIAL/PLATELET
Eosinophils Relative: 2 % (ref 0–5)
HCT: 34.6 % — ABNORMAL LOW (ref 36.0–46.0)
Hemoglobin: 11.4 g/dL — ABNORMAL LOW (ref 12.0–15.0)
Lymphocytes Relative: 23 % (ref 12–46)
Lymphs Abs: 2.4 10*3/uL (ref 0.7–4.0)
MCV: 95.3 fL (ref 78.0–100.0)
Monocytes Absolute: 0.7 10*3/uL (ref 0.1–1.0)
RBC: 3.63 MIL/uL — ABNORMAL LOW (ref 3.87–5.11)
WBC: 10.8 10*3/uL — ABNORMAL HIGH (ref 4.0–10.5)

## 2012-05-14 LAB — WET PREP, GENITAL

## 2012-05-14 LAB — BASIC METABOLIC PANEL
CO2: 28 mEq/L (ref 19–32)
Calcium: 9.2 mg/dL (ref 8.4–10.5)
Creatinine, Ser: 0.53 mg/dL (ref 0.50–1.10)
Glucose, Bld: 87 mg/dL (ref 70–99)

## 2012-05-14 MED ORDER — HYDROMORPHONE HCL PF 1 MG/ML IJ SOLN
1.0000 mg | Freq: Once | INTRAMUSCULAR | Status: AC
  Administered 2012-05-14: 1 mg via INTRAVENOUS
  Filled 2012-05-14: qty 1

## 2012-05-14 MED ORDER — HYDROMORPHONE HCL PF 1 MG/ML IJ SOLN
0.5000 mg | Freq: Once | INTRAMUSCULAR | Status: AC
  Administered 2012-05-15: 0.5 mg via INTRAVENOUS
  Filled 2012-05-14: qty 1

## 2012-05-14 NOTE — ED Notes (Addendum)
C/o R side pelvic pain, nausea, and vomiting since last night. Pt states pain feels like her previous ruptured ovarian cyst.
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
## 2012-05-15 ENCOUNTER — Emergency Department (HOSPITAL_COMMUNITY): Payer: BC Managed Care – PPO

## 2012-05-15 LAB — GC/CHLAMYDIA PROBE AMP, GENITAL: Chlamydia, DNA Probe: NEGATIVE

## 2012-05-15 MED ORDER — KETOROLAC TROMETHAMINE 30 MG/ML IJ SOLN
30.0000 mg | Freq: Once | INTRAMUSCULAR | Status: AC
  Administered 2012-05-15: 30 mg via INTRAVENOUS
  Filled 2012-05-15: qty 1

## 2012-05-15 MED ORDER — OXYCODONE-ACETAMINOPHEN 5-325 MG PO TABS: 1.0000 | ORAL_TABLET | Freq: Four times a day (QID) | ORAL | Status: AC | PRN

## 2012-05-15 MED ORDER — HYDROCODONE-ACETAMINOPHEN 5-500 MG PO TABS: 1.0000 | ORAL_TABLET | Freq: Four times a day (QID) | ORAL | Status: AC | PRN

## 2012-05-15 MED ORDER — METRONIDAZOLE 500 MG PO TABS: 500.0000 mg | ORAL_TABLET | Freq: Two times a day (BID) | ORAL | Status: AC

## 2012-05-15 MED ORDER — MORPHINE SULFATE 4 MG/ML IJ SOLN: 4.0000 mg | Freq: Once | INTRAMUSCULAR | Status: DC

## 2012-05-15 MED ORDER — ONDANSETRON HCL 4 MG/2ML IJ SOLN
4.0000 mg | Freq: Once | INTRAMUSCULAR | Status: AC
  Administered 2012-05-15: 4 mg via INTRAVENOUS
  Filled 2012-05-15: qty 2

## 2012-05-15 NOTE — ED Notes (Signed)
Pt back in room from US.

## 2012-05-15 NOTE — ED Notes (Signed)
Pt tx to US.

## 2012-05-15 NOTE — ED Provider Notes (Signed)
Pt hand off to me by Johnnette Gourd, PA-C.  " 36 year old female presents to emergency department with right-sided pelvic pain for the past 2 weeks worsening after having "rough intercourse with her husband" last night. She has a history of ruptured ovarian cysts on the right, the last one being in February of this year, and states this feels exactly the same. She rates the pain 6-7/10, states it is constant, dull with occasional sharp throbs which radiated to her back. She states her intercourse last night was very painful since trying a new position. The last time she had a ruptured cyst her menstrual cycle was leads and that her last menstrual cycle was late. Admits to associated nausea and decreased appetite. No vomiting. Denies any chance of pregnancy due to her husband having a vasectomy. Denies any vaginal pain, bleeding, discharge. Denies any urinary symptoms including dysuria, increased frequency, urgency or hematuria. Denies fever or chills."  Pt still awaiting transvaginal ultrasound to evaluate for ruptured ovarian cyst.   Results for orders placed during the hospital encounter of 05/14/12  URINALYSIS, ROUTINE W REFLEX MICROSCOPIC      Component Value Range   Color, Urine YELLOW  YELLOW   APPearance CLOUDY (*) CLEAR   Specific Gravity, Urine 1.023  1.005 - 1.030   pH 5.5  5.0 - 8.0   Glucose, UA NEGATIVE  NEGATIVE mg/dL   Hgb urine dipstick MODERATE (*) NEGATIVE   Bilirubin Urine NEGATIVE  NEGATIVE   Ketones, ur NEGATIVE  NEGATIVE mg/dL   Protein, ur NEGATIVE  NEGATIVE mg/dL   Urobilinogen, UA 0.2  0.0 - 1.0 mg/dL   Nitrite NEGATIVE  NEGATIVE   Leukocytes, UA NEGATIVE  NEGATIVE  POCT PREGNANCY, URINE      Component Value Range   Preg Test, Ur NEGATIVE  NEGATIVE  URINE MICROSCOPIC-ADD ON      Component Value Range   Squamous Epithelial / LPF MANY (*) RARE   RBC / HPF 21-50  <3 RBC/hpf  CBC WITH DIFFERENTIAL      Component Value Range   WBC 10.8 (*) 4.0 - 10.5 K/uL   RBC 3.63  (*) 3.87 - 5.11 MIL/uL   Hemoglobin 11.4 (*) 12.0 - 15.0 g/dL   HCT 19.1 (*) 47.8 - 29.5 %   MCV 95.3  78.0 - 100.0 fL   MCH 31.4  26.0 - 34.0 pg   MCHC 32.9  30.0 - 36.0 g/dL   RDW 62.1  30.8 - 65.7 %   Platelets 254  150 - 400 K/uL   Neutrophils Relative 69  43 - 77 %   Neutro Abs 7.5  1.7 - 7.7 K/uL   Lymphocytes Relative 23  12 - 46 %   Lymphs Abs 2.4  0.7 - 4.0 K/uL   Monocytes Relative 6  3 - 12 %   Monocytes Absolute 0.7  0.1 - 1.0 K/uL   Eosinophils Relative 2  0 - 5 %   Eosinophils Absolute 0.2  0.0 - 0.7 K/uL   Basophils Relative 0  0 - 1 %   Basophils Absolute 0.0  0.0 - 0.1 K/uL  BASIC METABOLIC PANEL      Component Value Range   Sodium 137  135 - 145 mEq/L   Potassium 3.6  3.5 - 5.1 mEq/L   Chloride 102  96 - 112 mEq/L   CO2 28  19 - 32 mEq/L   Glucose, Bld 87  70 - 99 mg/dL   BUN 7  6 - 23  mg/dL   Creatinine, Ser 4.09  0.50 - 1.10 mg/dL   Calcium 9.2  8.4 - 81.1 mg/dL   GFR calc non Af Amer >90  >90 mL/min   GFR calc Af Amer >90  >90 mL/min  WET PREP, GENITAL      Component Value Range   Yeast Wet Prep HPF POC NONE SEEN  NONE SEEN   Trich, Wet Prep NONE SEEN  NONE SEEN   Clue Cells Wet Prep HPF POC FEW (*) NONE SEEN   WBC, Wet Prep HPF POC FEW (*) NONE SEEN   US Transvaginal Non-ob  05/15/2012  *RADIOLOGY REPORT*  Clinical Data: Pelvic pain.  TRANSABDOMINAL AND TRANSVAGINAL ULTRASOUND OF PELVIS Technique:  Both transabdominal and transvaginal ultrasound examinations of the pelvis were performed. Transabdominal technique was performed for global imaging of the pelvis including uterus, ovaries, adnexal regions, and pelvic cul-de-sac.  It was necessary to proceed with endovaginal exam following the transabdominal exam to visualize the endometrium and ovaries.  Comparison:  None  Findings:  Uterus: The uterus is anteverted and measures about 10.6 x 4.4 x 5.5 cm.  Nabothian cysts in the cervix.  Endometrium: Homogeneous appearance of the endometrial stripe with measured  thickness of 10 mm.  Right ovary:  Right ovary measures 3.4 x 2.9 x 2.5 cm.  Normal follicular changes with dominant cyst measuring up to about 1.6 cm diameter.  Flow is demonstrated in the right ovary on color flow Doppler imaging.  Left ovary: The left ovary measures 4.6 x 3.4 by 3 cm.  Normal follicular changes with dominant simple cyst measuring up to about 3 cm diameter.  Flow is demonstrated in the left ovary on color flow Doppler imaging.  Other findings: No free fluid  IMPRESSION: Normal appearance of the uterus and endometrium.  Normal follicular changes in the ovaries.  No abnormal adnexal masses.   Original Report Authenticated By: Marlon Pel, M.D.    US Pelvis Complete  05/15/2012  *RADIOLOGY REPORT*  Clinical Data: Pelvic pain.  TRANSABDOMINAL AND TRANSVAGINAL ULTRASOUND OF PELVIS Technique:  Both transabdominal and transvaginal ultrasound examinations of the pelvis were performed. Transabdominal technique was performed for global imaging of the pelvis including uterus, ovaries, adnexal regions, and pelvic cul-de-sac.  It was necessary to proceed with endovaginal exam following the transabdominal exam to visualize the endometrium and ovaries.  Comparison:  None  Findings:  Uterus: The uterus is anteverted and measures about 10.6 x 4.4 x 5.5 cm.  Nabothian cysts in the cervix.  Endometrium: Homogeneous appearance of the endometrial stripe with measured thickness of 10 mm.  Right ovary:  Right ovary measures 3.4 x 2.9 x 2.5 cm.  Normal follicular changes with dominant cyst measuring up to about 1.6 cm diameter.  Flow is demonstrated in the right ovary on color flow Doppler imaging.  Left ovary: The left ovary measures 4.6 x 3.4 by 3 cm.  Normal follicular changes with dominant simple cyst measuring up to about 3 cm diameter.  Flow is demonstrated in the left ovary on color flow Doppler imaging.  Other findings: No free fluid  IMPRESSION: Normal appearance of the uterus and endometrium.  Normal  follicular changes in the ovaries.  No abnormal adnexal masses.   Original Report Authenticated By: Marlon Pel, M.D.    '  Pt has no abnormality to ultrasound. Safe to DC at this time with plan [facility] had discussed with patient.  Pt has been advised of the symptoms that warrant their return to  the ED. Patient has voiced understanding and has agreed to follow-up with the PCP or specialist.    Dorthula Matas, PA 05/15/12 0131

## 2012-05-15 NOTE — ED Notes (Signed)
Patient is alert and oriented x4.  Patient was explained discharge instructions and had no questions.  Patient's family is here to take her home.

## 2012-05-15 NOTE — ED Provider Notes (Signed)
Medical screening examination/treatment/procedure(s) were performed by non-physician practitioner and as supervising physician I was immediately available for consultation/collaboration.    Nelia Shi, MD 05/15/12 2000

## 2012-05-16 NOTE — ED Provider Notes (Signed)
Medical screening examination/treatment/procedure(s) were performed by non-physician practitioner and as supervising physician I was immediately available for consultation/collaboration.   Weronika Birch, MD 05/16/12 0549 

## 2012-05-25 ENCOUNTER — Ambulatory Visit: Payer: Self-pay

## 2012-08-29 ENCOUNTER — Encounter (HOSPITAL_COMMUNITY): Payer: Self-pay | Admitting: Adult Health

## 2012-08-29 ENCOUNTER — Emergency Department (HOSPITAL_COMMUNITY)
Admission: EM | Admit: 2012-08-29 | Discharge: 2012-08-29 | Disposition: A | Discharge status: Home / Self Care | Payer: BC Managed Care – PPO | Attending: Emergency Medicine | Admitting: Emergency Medicine

## 2012-08-29 ENCOUNTER — Emergency Department (HOSPITAL_COMMUNITY): Payer: BC Managed Care – PPO

## 2012-08-29 DIAGNOSIS — F17200 Nicotine dependence, unspecified, uncomplicated: Secondary | ICD-10-CM | POA: Insufficient documentation

## 2012-08-29 DIAGNOSIS — Z8742 Personal history of other diseases of the female genital tract: Secondary | ICD-10-CM | POA: Insufficient documentation

## 2012-08-29 DIAGNOSIS — Z3202 Encounter for pregnancy test, result negative: Secondary | ICD-10-CM | POA: Insufficient documentation

## 2012-08-29 DIAGNOSIS — F411 Generalized anxiety disorder: Secondary | ICD-10-CM | POA: Insufficient documentation

## 2012-08-29 DIAGNOSIS — Z87828 Personal history of other (healed) physical injury and trauma: Secondary | ICD-10-CM | POA: Insufficient documentation

## 2012-08-29 DIAGNOSIS — Z79899 Other long term (current) drug therapy: Secondary | ICD-10-CM | POA: Insufficient documentation

## 2012-08-29 DIAGNOSIS — Z8719 Personal history of other diseases of the digestive system: Secondary | ICD-10-CM | POA: Insufficient documentation

## 2012-08-29 DIAGNOSIS — N83209 Unspecified ovarian cyst, unspecified side: Secondary | ICD-10-CM | POA: Insufficient documentation

## 2012-08-29 DIAGNOSIS — R011 Cardiac murmur, unspecified: Secondary | ICD-10-CM | POA: Insufficient documentation

## 2012-08-29 LAB — COMPREHENSIVE METABOLIC PANEL
ALT: 7 U/L (ref 0–35)
AST: 13 U/L (ref 0–37)
Albumin: 4.1 g/dL (ref 3.5–5.2)
Calcium: 9.4 mg/dL (ref 8.4–10.5)
GFR calc Af Amer: >90 mL/min (ref >90)
Glucose, Bld: 94 mg/dL (ref 70–99)
Potassium: 3.3 mEq/L — ABNORMAL LOW (ref 3.5–5.1)
Sodium: 138 mEq/L (ref 135–145)
Total Protein: 6.7 g/dL (ref 6.0–8.3)

## 2012-08-29 LAB — URINALYSIS, ROUTINE W REFLEX MICROSCOPIC
Glucose, UA: NEGATIVE mg/dL
Nitrite: NEGATIVE
Specific Gravity, Urine: >1.03 — ABNORMAL HIGH (ref 1.005–1.030)
pH: 5.5 (ref 5.0–8.0)

## 2012-08-29 LAB — CBC
MCH: 30.6 pg (ref 26.0–34.0)
MCHC: 32.5 g/dL (ref 30.0–36.0)
Platelets: 234 10*3/uL (ref 150–400)
RDW: 13.2 % (ref 11.5–15.5)

## 2012-08-29 MED ORDER — ONDANSETRON HCL 4 MG/2ML IJ SOLN
4.0000 mg | Freq: Once | INTRAMUSCULAR | Status: AC
  Administered 2012-08-29: 4 mg via INTRAVENOUS
  Filled 2012-08-29: qty 2

## 2012-08-29 MED ORDER — HYDROMORPHONE HCL PF 1 MG/ML IJ SOLN
INTRAMUSCULAR | Status: AC
  Administered 2012-08-29: 1 mg via INTRAVENOUS
  Filled 2012-08-29: qty 1

## 2012-08-29 MED ORDER — OXYCODONE-ACETAMINOPHEN 5-325 MG PO TABS: 1.0000 | ORAL_TABLET | ORAL | Status: DC | PRN

## 2012-08-29 MED ORDER — HYDROMORPHONE HCL PF 1 MG/ML IJ SOLN
1.0000 mg | Freq: Once | INTRAMUSCULAR | Status: AC
  Administered 2012-08-29: 1 mg via INTRAVENOUS
  Filled 2012-08-29: qty 1

## 2012-08-29 MED ORDER — ONDANSETRON HCL 4 MG PO TABS: 4.0000 mg | ORAL_TABLET | Freq: Four times a day (QID) | ORAL | Status: DC

## 2012-08-29 NOTE — ED Notes (Signed)
Presents with left sided lower abdominal pain, went to OBGYN, found to have ovarian cysts and was given options by OBGYN, appointment to have laprostomy on Thursday. Pain is unbearable at this time, given vicodin and taking but unable to get relief from vicodin. Pt here is for pain control and is doubled over in pain.
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
## 2012-09-06 HISTORY — PX: LAPAROSCOPY: SHX197

## 2012-09-07 ENCOUNTER — Ambulatory Visit: Payer: Self-pay

## 2012-09-07 LAB — HEMATOCRIT: HCT: 35 % (ref 35.0–47.0)

## 2012-09-14 ENCOUNTER — Ambulatory Visit: Payer: Self-pay

## 2012-09-21 ENCOUNTER — Encounter (HOSPITAL_COMMUNITY): Payer: Self-pay | Admitting: *Deleted

## 2012-09-21 ENCOUNTER — Inpatient Hospital Stay (HOSPITAL_COMMUNITY)
Admission: AD | Admit: 2012-09-21 | Discharge: 2012-09-21 | Disposition: A | Discharge status: Home / Self Care | Payer: BC Managed Care – PPO | Source: Ambulatory Visit | Attending: Obstetrics and Gynecology | Admitting: Obstetrics and Gynecology

## 2012-09-21 DIAGNOSIS — G8918 Other acute postprocedural pain: Secondary | ICD-10-CM | POA: Insufficient documentation

## 2012-09-21 DIAGNOSIS — N949 Unspecified condition associated with female genital organs and menstrual cycle: Secondary | ICD-10-CM | POA: Insufficient documentation

## 2012-09-21 DIAGNOSIS — M545 Low back pain, unspecified: Secondary | ICD-10-CM | POA: Insufficient documentation

## 2012-09-21 DIAGNOSIS — R109 Unspecified abdominal pain: Secondary | ICD-10-CM | POA: Insufficient documentation

## 2012-09-21 DIAGNOSIS — G8929 Other chronic pain: Secondary | ICD-10-CM

## 2012-09-21 LAB — CBC WITH DIFFERENTIAL/PLATELET
Basophils Absolute: 0 10*3/uL (ref 0.0–0.1)
Basophils Relative: 0 % (ref 0–1)
Hemoglobin: 12.6 g/dL (ref 12.0–15.0)
MCHC: 32.3 g/dL (ref 30.0–36.0)
Neutro Abs: 3.2 10*3/uL (ref 1.7–7.7)
Neutrophils Relative %: 48 % (ref 43–77)
RDW: 13.2 % (ref 11.5–15.5)

## 2012-09-21 LAB — URINALYSIS, ROUTINE W REFLEX MICROSCOPIC
Glucose, UA: NEGATIVE mg/dL
Leukocytes, UA: NEGATIVE
Protein, ur: NEGATIVE mg/dL
Specific Gravity, Urine: 1.02 (ref 1.005–1.030)
Urobilinogen, UA: 0.2 mg/dL (ref 0.0–1.0)

## 2012-09-21 LAB — POCT PREGNANCY, URINE: Preg Test, Ur: NEGATIVE

## 2012-09-21 LAB — URINE MICROSCOPIC-ADD ON

## 2012-09-21 MED ORDER — OXYCODONE-ACETAMINOPHEN 5-325 MG PO TABS: 1.0000 | ORAL_TABLET | ORAL | Status: DC | PRN

## 2012-09-21 MED ORDER — KETOROLAC TROMETHAMINE 60 MG/2ML IM SOLN
60.0000 mg | Freq: Once | INTRAMUSCULAR | Status: AC
  Administered 2012-09-21: 60 mg via INTRAMUSCULAR
  Filled 2012-09-21: qty 2

## 2012-09-21 NOTE — MAU Note (Signed)
Pt had  exploratory laparoscopic surgery  On 09/14/12 Still having in RLQ pain with sharp pain near incision sites. Had called her gyn/surg and did not get any satisfaction and her pain issues. Pt has been using percocet with only minimal relief.

## 2012-09-21 NOTE — MAU Note (Signed)
Pt states had laparoscopy done by Dr. Margart Sickles on 09/14/2012 at  for 6cm cyst on ovary, then found to have cyst in ovary as well, prior hx of ruptured cysts. Here for pain, abdomen distended, bloated feeling, lower abd pain into back, denies uti s/s.
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
## 2012-09-25 NOTE — MAU Provider Note (Signed)
Attestation of Attending Supervision of Advanced Practitioner (CNM/NP): Evaluation and management procedures were performed by the Advanced Practitioner under my supervision and collaboration.  I have reviewed the Advanced Practitioner's note and chart, and I agree with the management and plan.  Webber Michiels 09/25/2012 3:21 PM

## 2012-09-29 ENCOUNTER — Inpatient Hospital Stay (HOSPITAL_COMMUNITY): Payer: BC Managed Care – PPO

## 2012-09-29 ENCOUNTER — Encounter (HOSPITAL_COMMUNITY): Payer: Self-pay

## 2012-09-29 ENCOUNTER — Inpatient Hospital Stay (HOSPITAL_COMMUNITY)
Admission: AD | Admit: 2012-09-29 | Discharge: 2012-09-29 | Disposition: A | Discharge status: Home / Self Care | Payer: BC Managed Care – PPO | Source: Ambulatory Visit | Attending: Obstetrics & Gynecology | Admitting: Obstetrics & Gynecology

## 2012-09-29 DIAGNOSIS — N949 Unspecified condition associated with female genital organs and menstrual cycle: Secondary | ICD-10-CM | POA: Insufficient documentation

## 2012-09-29 DIAGNOSIS — N83209 Unspecified ovarian cyst, unspecified side: Secondary | ICD-10-CM | POA: Insufficient documentation

## 2012-09-29 DIAGNOSIS — R102 Pelvic and perineal pain: Secondary | ICD-10-CM

## 2012-09-29 DIAGNOSIS — R109 Unspecified abdominal pain: Secondary | ICD-10-CM | POA: Insufficient documentation

## 2012-09-29 LAB — URINALYSIS, ROUTINE W REFLEX MICROSCOPIC
Bilirubin Urine: NEGATIVE
Glucose, UA: NEGATIVE mg/dL
Ketones, ur: NEGATIVE mg/dL
Leukocytes, UA: NEGATIVE
pH: 6 (ref 5.0–8.0)

## 2012-09-29 LAB — URINE MICROSCOPIC-ADD ON

## 2012-09-29 MED ORDER — OXYCODONE-ACETAMINOPHEN 5-325 MG PO TABS: 1.0000 | ORAL_TABLET | Freq: Four times a day (QID) | ORAL | Status: DC | PRN

## 2012-09-29 MED ORDER — IBUPROFEN 600 MG PO TABS
600.0000 mg | ORAL_TABLET | Freq: Once | ORAL | Status: AC
  Administered 2012-09-29: 600 mg via ORAL
  Filled 2012-09-29: qty 1
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
## 2012-09-29 NOTE — Progress Notes (Signed)
Patient is not in lobby

## 2012-09-29 NOTE — MAU Note (Signed)
Patient is in with c/o constant sharp lower abdominal pain. She states that she have an appt with dr constant on feb12th and needs pain medications to last till then. She states that she had her diagnostic laparascopy 3 weeks ago at [facility] but was unsatisfied with her care there. She came to MAU 2 weeks for the same pain and was given 30tablets percocet which she does not have anymore of.

## 2012-09-29 NOTE — MAU Note (Signed)
Had laparoscopy surgery 3 weeks had cysts, having a lot of abdominal pain has been managing it with pain medication and Ibuprofen, patient desires second opinion.  Does not have anymore pain medication.

## 2012-10-18 ENCOUNTER — Encounter: Payer: BC Managed Care – PPO | Admitting: Obstetrics and Gynecology

## 2013-04-21 ENCOUNTER — Emergency Department: Payer: Self-pay | Admitting: Emergency Medicine

## 2013-04-21 LAB — URINALYSIS, COMPLETE
Ketone: NEGATIVE
Ph: 6 (ref 4.5–8.0)
Protein: NEGATIVE
RBC,UR: 68 /HPF (ref 0–5)
WBC UR: 3 /HPF (ref 0–5)

## 2013-04-21 LAB — COMPREHENSIVE METABOLIC PANEL
Bilirubin,Total: 0.4 mg/dL (ref 0.2–1.0)
Chloride: 103 mmol/L (ref 98–107)
Co2: 26 mmol/L (ref 21–32)
EGFR (African American): >60
Sodium: 135 mmol/L — ABNORMAL LOW (ref 136–145)
Total Protein: 6.6 g/dL (ref 6.4–8.2)

## 2013-04-21 LAB — CBC
MCH: 30.2 pg (ref 26.0–34.0)
MCV: 88 fL (ref 80–100)
Platelet: 327 10*3/uL (ref 150–440)
WBC: 5.9 10*3/uL (ref 3.6–11.0)

## 2013-06-18 ENCOUNTER — Ambulatory Visit: Payer: Self-pay | Admitting: Physician Assistant

## 2013-06-26 ENCOUNTER — Ambulatory Visit: Payer: Self-pay | Admitting: Emergency Medicine

## 2013-08-23 ENCOUNTER — Ambulatory Visit: Payer: BC Managed Care – PPO | Admitting: Obstetrics and Gynecology

## 2013-08-23 DIAGNOSIS — N949 Unspecified condition associated with female genital organs and menstrual cycle: Secondary | ICD-10-CM

## 2013-08-31 ENCOUNTER — Emergency Department: Payer: Self-pay | Admitting: Emergency Medicine

## 2013-08-31 LAB — URINALYSIS, COMPLETE
Bacteria: NONE SEEN
Glucose,UR: NEGATIVE mg/dL (ref 0–75)
Ketone: NEGATIVE
Ph: 8 (ref 4.5–8.0)
Protein: 100
RBC,UR: 1220 /HPF (ref 0–5)
Specific Gravity: 1.011 (ref 1.003–1.030)
WBC UR: 818 /HPF (ref 0–5)

## 2013-08-31 LAB — CBC
HCT: 39.3 % (ref 35.0–47.0)
MCV: 94 fL (ref 80–100)

## 2013-08-31 LAB — COMPREHENSIVE METABOLIC PANEL
Albumin: 3.8 g/dL (ref 3.4–5.0)
Alkaline Phosphatase: 57 U/L
Co2: 29 mmol/L (ref 21–32)
EGFR (Non-African Amer.): >60
Glucose: 113 mg/dL — ABNORMAL HIGH (ref 65–99)
Osmolality: 274 (ref 275–301)
Sodium: 138 mmol/L (ref 136–145)
Total Protein: 7.4 g/dL (ref 6.4–8.2)

## 2013-09-25 ENCOUNTER — Emergency Department: Payer: Self-pay | Admitting: Emergency Medicine

## 2013-09-25 LAB — COMPREHENSIVE METABOLIC PANEL
ALBUMIN: 4 g/dL (ref 3.4–5.0)
ALK PHOS: 61 U/L
ALT: 15 U/L (ref 12–78)
ANION GAP: 5 — AB (ref 7–16)
AST: 16 U/L (ref 15–37)
BUN: 12 mg/dL (ref 7–18)
Bilirubin,Total: 0.2 mg/dL (ref 0.2–1.0)
CALCIUM: 9 mg/dL (ref 8.5–10.1)
CREATININE: 0.92 mg/dL (ref 0.60–1.30)
Chloride: 102 mmol/L (ref 98–107)
Co2: 32 mmol/L (ref 21–32)
EGFR (African American): >60
EGFR (Non-African Amer.): >60
Glucose: 127 mg/dL — ABNORMAL HIGH (ref 65–99)
OSMOLALITY: 279 (ref 275–301)
Potassium: 3.6 mmol/L (ref 3.5–5.1)
Sodium: 139 mmol/L (ref 136–145)
Total Protein: 7.9 g/dL (ref 6.4–8.2)

## 2013-09-25 LAB — CBC
HCT: 39.1 % (ref 35.0–47.0)
HGB: 12.8 g/dL (ref 12.0–16.0)
MCH: 30.4 pg (ref 26.0–34.0)
MCHC: 32.7 g/dL (ref 32.0–36.0)
MCV: 93 fL (ref 80–100)
Platelet: 405 10*3/uL (ref 150–440)
RBC: 4.21 10*6/uL (ref 3.80–5.20)
RDW: 13.8 % (ref 11.5–14.5)
WBC: 6.3 10*3/uL (ref 3.6–11.0)

## 2013-09-25 LAB — URINALYSIS, COMPLETE
BACTERIA: NONE SEEN
BLOOD: NEGATIVE
Bilirubin,UR: NEGATIVE
Glucose,UR: NEGATIVE mg/dL (ref 0–75)
Hyaline Cast: 1
Ketone: NEGATIVE
Nitrite: NEGATIVE
Ph: 7 (ref 4.5–8.0)
Protein: NEGATIVE
RBC,UR: 2 /HPF (ref 0–5)
SPECIFIC GRAVITY: 1.014 (ref 1.003–1.030)
WBC UR: 4 /HPF (ref 0–5)

## 2013-09-25 LAB — SALICYLATE LEVEL: SALICYLATES, SERUM: 3 mg/dL — AB

## 2013-09-25 LAB — DRUG SCREEN, URINE
Amphetamines, Ur Screen: NEGATIVE (ref ?–1000)
BARBITURATES, UR SCREEN: NEGATIVE (ref ?–200)
BENZODIAZEPINE, UR SCRN: POSITIVE (ref ?–200)
CANNABINOID 50 NG, UR ~~LOC~~: NEGATIVE (ref ?–50)
COCAINE METABOLITE, UR ~~LOC~~: NEGATIVE (ref ?–300)
MDMA (ECSTASY) UR SCREEN: NEGATIVE (ref ?–500)
Methadone, Ur Screen: NEGATIVE (ref ?–300)
Opiate, Ur Screen: POSITIVE (ref ?–300)
Phencyclidine (PCP) Ur S: NEGATIVE (ref ?–25)
Tricyclic, Ur Screen: NEGATIVE (ref ?–1000)

## 2013-09-25 LAB — ETHANOL

## 2013-09-25 LAB — ACETAMINOPHEN LEVEL: Acetaminophen: 26 ug/mL

## 2013-12-11 ENCOUNTER — Encounter: Payer: BC Managed Care – PPO | Admitting: Family Medicine

## 2014-01-02 ENCOUNTER — Ambulatory Visit (INDEPENDENT_AMBULATORY_CARE_PROVIDER_SITE_OTHER): Payer: BC Managed Care – PPO | Admitting: Family Medicine

## 2014-01-02 ENCOUNTER — Encounter: Payer: Self-pay | Admitting: Family Medicine

## 2014-01-02 VITALS — BP 97/66 | HR 91 | Ht 64.0 in | Wt 110.0 lb

## 2014-01-02 DIAGNOSIS — F411 Generalized anxiety disorder: Secondary | ICD-10-CM

## 2014-01-02 DIAGNOSIS — Z124 Encounter for screening for malignant neoplasm of cervix: Secondary | ICD-10-CM

## 2014-01-02 DIAGNOSIS — N949 Unspecified condition associated with female genital organs and menstrual cycle: Secondary | ICD-10-CM

## 2014-01-02 DIAGNOSIS — Z30013 Encounter for initial prescription of injectable contraceptive: Secondary | ICD-10-CM

## 2014-01-02 DIAGNOSIS — F17200 Nicotine dependence, unspecified, uncomplicated: Secondary | ICD-10-CM

## 2014-01-02 DIAGNOSIS — Z720 Tobacco use: Secondary | ICD-10-CM

## 2014-01-02 DIAGNOSIS — Z3009 Encounter for other general counseling and advice on contraception: Secondary | ICD-10-CM

## 2014-01-02 DIAGNOSIS — Z1151 Encounter for screening for human papillomavirus (HPV): Secondary | ICD-10-CM

## 2014-01-02 DIAGNOSIS — F988 Other specified behavioral and emotional disorders with onset usually occurring in childhood and adolescence: Secondary | ICD-10-CM

## 2014-01-02 DIAGNOSIS — Z01812 Encounter for preprocedural laboratory examination: Secondary | ICD-10-CM

## 2014-01-02 DIAGNOSIS — Z309 Encounter for contraceptive management, unspecified: Secondary | ICD-10-CM

## 2014-01-02 DIAGNOSIS — R102 Pelvic and perineal pain unspecified side: Secondary | ICD-10-CM

## 2014-01-02 DIAGNOSIS — N926 Irregular menstruation, unspecified: Secondary | ICD-10-CM

## 2014-01-02 DIAGNOSIS — F419 Anxiety disorder, unspecified: Secondary | ICD-10-CM | POA: Insufficient documentation

## 2014-01-02 DIAGNOSIS — IMO0001 Reserved for inherently not codable concepts without codable children: Secondary | ICD-10-CM

## 2014-01-02 MED ORDER — MEDROXYPROGESTERONE ACETATE 150 MG/ML IM SUSP: 150.0000 mg | INTRAMUSCULAR | Status: DC

## 2014-01-02 MED ORDER — MEDROXYPROGESTERONE ACETATE 150 MG/ML IM SUSP
150.0000 mg | INTRAMUSCULAR | Status: AC
  Administered 2014-01-02: 150 mg via INTRAMUSCULAR

## 2014-01-02 NOTE — Patient Instructions (Signed)
Pelvic Pain, Female °Female pelvic pain can be caused by many different things and start from a variety of places. Pelvic pain refers to pain that is located in the lower half of the abdomen and between your hips. The pain may occur over a short period of time (acute) or may be reoccurring (chronic). The cause of pelvic pain may be related to disorders affecting the female reproductive organs (gynecologic), but it may also be related to the bladder, kidney stones, an intestinal complication, or muscle or skeletal problems. Getting help right away for pelvic pain is important, especially if there has been severe, sharp, or a sudden onset of unusual pain. It is also important to get help right away because some types of pelvic pain can be life threatening.  °CAUSES  °Below are only some of the causes of pelvic pain. The causes of pelvic pain can be in one of several categories.  °· Gynecologic. °· Pelvic inflammatory disease. °· Sexually transmitted infection. °· Ovarian cyst or a twisted ovarian ligament (ovarian torsion). °· Uterine lining that grows outside the uterus (endometriosis). °· Fibroids, cysts, or tumors. °· Ovulation. °· Pregnancy. °· Pregnancy that occurs outside the uterus (ectopic pregnancy). °· Miscarriage. °· Labor. °· Abruption of the placenta or ruptured uterus. °· Infection. °· Uterine infection (endometritis). °· Bladder infection. °· Diverticulitis. °· Miscarriage related to a uterine infection (septic abortion). °· Bladder. °· Inflammation of the bladder (cystitis). °· Kidney stone(s). °· Gastrointenstinal. °· Constipation. °· Diverticulitis. °· Neurologic. °· Trauma. °· Feeling pelvic pain because of mental or emotional causes (psychosomatic). °· Cancers of the bowel or pelvis. °EVALUATION  °Your caregiver will want to take a careful history of your concerns. This includes recent changes in your health, a careful gynecologic history of your periods (menses), and a sexual history. Obtaining  your family history and medical history is also important. Your caregiver may suggest a pelvic exam. A pelvic exam will help identify the location and severity of the pain. It also helps in the evaluation of which organ system may be involved. In order to identify the cause of the pelvic pain and be properly treated, your caregiver may order tests. These tests may include:  °· A pregnancy test. °· Pelvic ultrasonography. °· An X-ray exam of the abdomen. °· A urinalysis or evaluation of vaginal discharge. °· Blood tests. °HOME CARE INSTRUCTIONS  °· Only take over-the-counter or prescription medicines for pain, discomfort, or fever as directed by your caregiver.   °· Rest as directed by your caregiver.   °· Eat a balanced diet.   °· Drink enough fluids to make your urine clear or pale yellow, or as directed.   °· Avoid sexual intercourse if it causes pain.   °· Apply warm or cold compresses to the lower abdomen depending on which one helps the pain.   °· Avoid stressful situations.   °· Keep a journal of your pelvic pain. Write down when it started, where the pain is located, and if there are things that seem to be associated with the pain, such as food or your menstrual cycle. °· Follow up with your caregiver as directed.   °SEEK MEDICAL CARE IF: °· Your medicine does not help your pain. °· You have abnormal vaginal discharge. °SEEK IMMEDIATE MEDICAL CARE IF:  °· You have heavy bleeding from the vagina.   °· Your pelvic pain increases.   °· You feel lightheaded or faint.   °· You have chills.   °· You have pain with urination or blood in your urine.   °· You have uncontrolled   diarrhea or vomiting.   °· You have a fever or persistent symptoms for more than 3 days. °· You have a fever and your symptoms suddenly get worse.   °· You are being physically or sexually abused.   °MAKE SURE YOU: °· Understand these instructions. °· Will watch your condition. °· Will get help if you are not doing well or get worse. °Document  Released: 07/20/2004 Document Revised: 02/22/2012 Document Reviewed: 12/13/2011 °[employer]® Patient Information ©2014 [employer]. ° °

## 2014-01-02 NOTE — Assessment & Plan Note (Signed)
?   Stress related.  Check TSH, FSH, PRL.  Will begin Depo which may fix the problem and make her amenorrheic.

## 2014-01-02 NOTE — Assessment & Plan Note (Addendum)
?   Cause, negative w/u including laparoscopy--will check records..Pelvic sono. May be related to retroverted uterus? Depo may help with this as well.

## 2014-01-02 NOTE — Progress Notes (Signed)
    Subjective:    Patient ID: Margaret Washington is a 38 y.o. female presenting with Menstrual Problem  on 01/02/2014  HPI: Had a lapraoscopic paraovarian cystectomy and D & C last year with [facility].  Return of problems, including irregular cycles, heavier bleeding. Pelvic pain, especially back pain with cycles.  On no birth control right now. Using abstinence. Open to birth control options. Has been on Depo and had good results with it in the past. No significant weight changes.  Last pelvic sono was > 1 year ago. Last pap was 2009.  Reports pelvic pain on right side.  No endometriosis noted on laparoscopy.  On Adderall and Xanax from PCP.  Going through divorce, and has had a lot of stress in her life. Denies narcotic use presently.  Review of Systems  Constitutional: Negative for fever and chills.  Respiratory: Negative for shortness of breath.   Cardiovascular: Negative for chest pain.  Gastrointestinal: Negative for nausea, vomiting, abdominal pain, diarrhea and constipation.  Genitourinary: Positive for vaginal bleeding. Negative for dysuria, vaginal discharge and vaginal pain.  Musculoskeletal: Positive for back pain.      Objective:    BP 97/66  Pulse 91  Ht 5\' 4"  (1.626 m)  Wt 110 lb (49.896 kg)  BMI 18.87 kg/m2  LMP 11/16/2013 Physical Exam  Vitals reviewed. Constitutional: She appears well-developed and well-nourished. No distress.  HENT:  Head: Normocephalic and atraumatic.  Eyes: No scleral icterus.  Neck: Neck supple.  Cardiovascular: Normal rate and regular rhythm.   Pulmonary/Chest: Effort normal.  Abdominal: Soft. There is no tenderness.  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       Assessment & Plan:   Pelvic pain ? Cause, negative w/u including laparoscopy--will check records..Pelvic sono. May be related to retroverted uterus? Depo may help  with this as well.  Irregular menses ? Stress related.  Check TSH, FSH, PRL.  Will begin Depo which may fix the problem and make her amenorrheic.  Pap smear today  Return in about 4 weeks (around 01/30/2014).

## 2014-01-03 LAB — PROLACTIN: PROLACTIN: 3.9 ng/mL

## 2014-01-03 LAB — FOLLICLE STIMULATING HORMONE: FSH: 1 m[IU]/mL

## 2014-01-03 LAB — TSH: TSH: 0.502 u[IU]/mL (ref 0.350–4.500)

## 2014-01-10 ENCOUNTER — Telehealth: Payer: Self-pay

## 2014-01-10 ENCOUNTER — Ambulatory Visit (HOSPITAL_COMMUNITY)
Admission: RE | Admit: 2014-01-10 | Discharge: 2014-01-10 | Disposition: A | Discharge status: Home / Self Care | Payer: BC Managed Care – PPO | Source: Ambulatory Visit | Attending: Family Medicine | Admitting: Family Medicine

## 2014-01-10 ENCOUNTER — Other Ambulatory Visit: Payer: Self-pay | Admitting: Family Medicine

## 2014-01-10 DIAGNOSIS — N938 Other specified abnormal uterine and vaginal bleeding: Secondary | ICD-10-CM | POA: Insufficient documentation

## 2014-01-10 DIAGNOSIS — N949 Unspecified condition associated with female genital organs and menstrual cycle: Secondary | ICD-10-CM | POA: Insufficient documentation

## 2014-01-10 DIAGNOSIS — N926 Irregular menstruation, unspecified: Secondary | ICD-10-CM

## 2014-01-10 DIAGNOSIS — N925 Other specified irregular menstruation: Secondary | ICD-10-CM | POA: Insufficient documentation

## 2014-01-10 NOTE — Telephone Encounter (Signed)
Message copied by Geanie Logan on Thu Jan 10, 2014  4:41 PM ------      Message from: Donnamae Jude      Created: Thu Jan 10, 2014  4:08 PM       Normal [place] please inform pt. ------

## 2014-01-10 NOTE — Telephone Encounter (Signed)
Attempted to call pt. No answer. Left message for pt. To call clinic.

## 2014-01-15 NOTE — Telephone Encounter (Signed)
Attempted to call pt. No answer. Left message stating we are calling with results, non urgent, please call clinic. Will send letter.

## 2014-02-01 ENCOUNTER — Encounter: Payer: Self-pay | Admitting: General Practice

## 2014-03-29 ENCOUNTER — Encounter: Payer: Self-pay | Admitting: General Practice

## 2014-04-01 DIAGNOSIS — Z87448 Personal history of other diseases of urinary system: Secondary | ICD-10-CM | POA: Insufficient documentation

## 2014-04-01 DIAGNOSIS — Z720 Tobacco use: Secondary | ICD-10-CM | POA: Insufficient documentation

## 2014-04-01 DIAGNOSIS — Z87718 Personal history of other specified (corrected) congenital malformations of genitourinary system: Secondary | ICD-10-CM | POA: Insufficient documentation

## 2014-06-21 ENCOUNTER — Emergency Department: Payer: Self-pay | Admitting: Emergency Medicine

## 2014-07-08 ENCOUNTER — Encounter: Payer: Self-pay | Admitting: Family Medicine

## 2014-12-27 NOTE — Op Note (Signed)
PATIENT NAME:  Margaret Washington, Margaret Washington MR#:  [id number] DATE OF BIRTH:  Oct 25, 1975  DATE OF PROCEDURE:  09/14/2012  PREOPERATIVE DIAGNOSIS: Chronic pelvic pain.  POSTOPERATIVE DIAGNOSIS: Chronic pelvic pain, left paratubal cyst.   OPERATION PERFORMED:  1.  Dilation and curettage.  2.  Diagnostic laparoscopy. 3.  Left paratubal cystectomy.   SURGEON: Wonda Cheng. Laurey Morale, M.D.   OPERATIVE FINDINGS: Totally normal pelvis except for left paratubal cyst.   DESCRIPTION OF PROCEDURE: After adequate general anesthesia, the patient was prepped and draped in a routine fashion. An infraumbilical incision was made through the skin and approximately 3 L of carbon dioxide were insufflated without incident. During insufflation, the cervix was dilated with ease. The uterine cavity was systematically curetted with return of a small amount of normal-appearing tissue. The uterine manipulator was placed and the bladder was drained. The laparoscope was then inserted. The above findings were noted. The left paratubal cyst was opened. CO2 was allowed to escape and the skin was closed in a routine fashion. The patient tolerated the procedure well and left the operating room in good condition. Sponge and needle counts were said to be correct at the end of the procedure. Estimated blood loss minimal.    ____________________________ Wonda Cheng. Laurey Morale, MD pjr:aw D: 09/14/2012 10:18:01 ET T: 09/14/2012 10:31:02 ET JOB#: [id number]  cc: Wonda Cheng. Laurey Morale, MD, <Dictator> Rosina Lowenstein MD ELECTRONICALLY SIGNED 09/15/2012 21:11

## 2015-01-18 ENCOUNTER — Encounter: Payer: Self-pay | Admitting: Emergency Medicine

## 2015-01-18 ENCOUNTER — Emergency Department
Admission: EM | Admit: 2015-01-18 | Discharge: 2015-01-18 | Disposition: A | Discharge status: Home / Self Care | Payer: Medicaid Other | Attending: Emergency Medicine | Admitting: Emergency Medicine

## 2015-01-18 DIAGNOSIS — Z792 Long term (current) use of antibiotics: Secondary | ICD-10-CM | POA: Insufficient documentation

## 2015-01-18 DIAGNOSIS — Z79899 Other long term (current) drug therapy: Secondary | ICD-10-CM | POA: Diagnosis not present

## 2015-01-18 DIAGNOSIS — R21 Rash and other nonspecific skin eruption: Secondary | ICD-10-CM | POA: Diagnosis present

## 2015-01-18 DIAGNOSIS — A6920 Lyme disease, unspecified: Secondary | ICD-10-CM | POA: Diagnosis not present

## 2015-01-18 DIAGNOSIS — L299 Pruritus, unspecified: Secondary | ICD-10-CM

## 2015-01-18 DIAGNOSIS — Z720 Tobacco use: Secondary | ICD-10-CM | POA: Insufficient documentation

## 2015-01-18 MED ORDER — FLUCONAZOLE 150 MG PO TABS: 150.0000 mg | ORAL_TABLET | Freq: Every day | ORAL | Status: DC

## 2015-01-18 MED ORDER — DIPHENHYDRAMINE HCL 25 MG PO CAPS
25.0000 mg | ORAL_CAPSULE | Freq: Once | ORAL | Status: AC
  Administered 2015-01-18: 25 mg via ORAL

## 2015-01-18 MED ORDER — ONDANSETRON 4 MG PO TBDP
ORAL_TABLET | ORAL | Status: AC
  Administered 2015-01-18: 4 mg via ORAL
  Filled 2015-01-18: qty 1

## 2015-01-18 MED ORDER — DIPHENHYDRAMINE HCL 25 MG PO CAPS
ORAL_CAPSULE | ORAL | Status: AC
  Administered 2015-01-18: 25 mg via ORAL
  Filled 2015-01-18: qty 1

## 2015-01-18 MED ORDER — ONDANSETRON 4 MG PO TBDP
4.0000 mg | ORAL_TABLET | Freq: Once | ORAL | Status: AC
  Administered 2015-01-18: 4 mg via ORAL

## 2015-01-18 MED ORDER — DOXYCYCLINE HYCLATE 100 MG PO TABS
100.0000 mg | ORAL_TABLET | Freq: Once | ORAL | Status: AC
  Administered 2015-01-18: 100 mg via ORAL
  Filled 2015-01-18: qty 1

## 2015-01-18 MED ORDER — HYDROXYZINE HCL 10 MG PO TABS: 10.0000 mg | ORAL_TABLET | Freq: Three times a day (TID) | ORAL | Status: DC | PRN

## 2015-01-18 MED ORDER — DOXYCYCLINE HYCLATE 50 MG PO CAPS: 100.0000 mg | ORAL_CAPSULE | Freq: Two times a day (BID) | ORAL | Status: DC
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
## 2015-01-18 NOTE — Discharge Instructions (Signed)
Pruritus  Pruritus is an itch. There are many different problems that can cause an itch. Dry skin is one of the most common causes of itching. Most cases of itching do not require medical attention.  HOME CARE INSTRUCTIONS  Make sure your skin is moistened on a regular basis. A moisturizer that contains petroleum jelly is best for keeping moisture in your skin. If you develop a rash, you may try the following for relief:   Use corticosteroid cream.  Apply cool compresses to the affected areas.  Bathe with Epsom salts or baking soda in the bathwater.  Soak in colloidal oatmeal baths. These are available at your pharmacy.  Apply baking soda paste to the rash. Stir water into baking soda until it reaches a paste-like consistency.  Use an anti-itch lotion.  Take over-the-counter diphenhydramine medicine by mouth as the instructions direct.  Avoid scratching. Scratching may cause the rash to become infected. If itching is very bad, your caregiver may suggest prescription lotions or creams to lessen your symptoms.  Avoid hot showers, which can make itching worse. A cold shower may help with itching as long as you use a moisturizer after the shower. SEEK MEDICAL CARE IF: The itching does not go away after several days. Document Released: 05/05/2011 Document Revised: 01/07/2014 Document Reviewed: 05/05/2011 [facility] Patient Information 2015 [place], [place]. This information is not intended to replace advice given to you by your health care provider. Make sure you discuss any questions you have with your health care provider.  Rash A rash is a change in the color or texture of your skin. There are many different types of rashes. You may have other problems that accompany your rash. CAUSES   Infections.  Allergic reactions. This can include allergies to pets or foods.  Certain medicines.  Exposure to certain chemicals, soaps, or cosmetics.  Heat.  Exposure to poisonous  plants.  Tumors, both cancerous and noncancerous. SYMPTOMS   Redness.  Scaly skin.  Itchy skin.  Dry or cracked skin.  Bumps.  Blisters.  Pain. DIAGNOSIS  Your caregiver may do a physical exam to determine what type of rash you have. A skin sample (biopsy) may be taken and examined under a microscope. TREATMENT  Treatment depends on the type of rash you have. Your caregiver may prescribe certain medicines. For serious conditions, you may need to see a skin doctor (dermatologist). HOME CARE INSTRUCTIONS   Avoid the substance that caused your rash.  Do not scratch your rash. This can cause infection.  You may take cool baths to help stop itching.  Only take over-the-counter or prescription medicines as directed by your caregiver.  Keep all follow-up appointments as directed by your caregiver. SEEK IMMEDIATE MEDICAL CARE IF:  You have increasing pain, swelling, or redness.  You have a fever.  You have new or severe symptoms.  You have body aches, diarrhea, or vomiting.  Your rash is not better after 3 days. MAKE SURE YOU:  Understand these instructions.  Will watch your condition.  Will get help right away if you are not doing well or get worse. Document Released: 08/13/2002 Document Revised: 11/15/2011 Document Reviewed: 06/07/2011 [facility] Patient Information 2015 [place], [place]. This information is not intended to replace advice given to you by your health care provider. Make sure you discuss any questions you have with your health care provider.

## 2015-01-18 NOTE — ED Notes (Signed)
Patient with rash to right lower arm that started Thursday. Today the rash became worse and now has some swelling to her arm.

## 2015-01-18 NOTE — ED Notes (Addendum)
Pt reports rash starting from 4th digit of left hand, spreading up her arm to the elbow.  Pt reports it started on Thursday.  Pt reports being out in woody areas and possible exposure to poison ivy.  Rash visible on finger, forearm, and elbow, raised with swelling noted.  Pt reports intense itching but denies pain except when rash is bumped.  Pt tx at home with calamine lotion w/ limited relief.  Pt NAD at this time.

## 2015-02-17 ENCOUNTER — Emergency Department
Admission: EM | Admit: 2015-02-17 | Discharge: 2015-02-18 | Disposition: A | Discharge status: Home / Self Care | Payer: Medicaid Other | Attending: Emergency Medicine | Admitting: Emergency Medicine

## 2015-02-17 ENCOUNTER — Encounter: Payer: Self-pay | Admitting: Urgent Care

## 2015-02-17 DIAGNOSIS — S0990XA Unspecified injury of head, initial encounter: Secondary | ICD-10-CM | POA: Insufficient documentation

## 2015-02-17 DIAGNOSIS — Z720 Tobacco use: Secondary | ICD-10-CM | POA: Insufficient documentation

## 2015-02-17 DIAGNOSIS — S0591XA Unspecified injury of right eye and orbit, initial encounter: Secondary | ICD-10-CM | POA: Diagnosis present

## 2015-02-17 DIAGNOSIS — W228XXA Striking against or struck by other objects, initial encounter: Secondary | ICD-10-CM | POA: Diagnosis not present

## 2015-02-17 DIAGNOSIS — Y9301 Activity, walking, marching and hiking: Secondary | ICD-10-CM | POA: Insufficient documentation

## 2015-02-17 DIAGNOSIS — Y9289 Other specified places as the place of occurrence of the external cause: Secondary | ICD-10-CM | POA: Insufficient documentation

## 2015-02-17 DIAGNOSIS — Z79899 Other long term (current) drug therapy: Secondary | ICD-10-CM | POA: Diagnosis not present

## 2015-02-17 DIAGNOSIS — S0501XA Injury of conjunctiva and corneal abrasion without foreign body, right eye, initial encounter: Secondary | ICD-10-CM | POA: Diagnosis not present

## 2015-02-17 DIAGNOSIS — Z792 Long term (current) use of antibiotics: Secondary | ICD-10-CM | POA: Diagnosis not present

## 2015-02-17 DIAGNOSIS — Y998 Other external cause status: Secondary | ICD-10-CM | POA: Diagnosis not present

## 2015-02-17 MED ORDER — OXYCODONE-ACETAMINOPHEN 5-325 MG PO TABS: 1.0000 | ORAL_TABLET | ORAL | Status: DC | PRN

## 2015-02-17 MED ORDER — FLUORESCEIN SODIUM 1 MG OP STRP
ORAL_STRIP | OPHTHALMIC | Status: AC
  Administered 2015-02-17: 1 via OPHTHALMIC
  Filled 2015-02-17: qty 1

## 2015-02-17 MED ORDER — TETRACAINE HCL 0.5 % OP SOLN
1.0000 [drp] | Freq: Once | OPHTHALMIC | Status: AC
  Administered 2015-02-17: 1 [drp] via OPHTHALMIC

## 2015-02-17 MED ORDER — FLUORESCEIN SODIUM 1 MG OP STRP
1.0000 | ORAL_STRIP | Freq: Once | OPHTHALMIC | Status: AC
  Administered 2015-02-17: 1 via OPHTHALMIC

## 2015-02-17 MED ORDER — TETRACAINE HCL 0.5 % OP SOLN
OPHTHALMIC | Status: AC
Start: 2015-02-17 — End: 2015-02-17
  Administered 2015-02-17: 1 [drp] via OPHTHALMIC
  Filled 2015-02-17: qty 2

## 2015-02-17 MED ORDER — EYE WASH OPHTH SOLN: 1.0000 [drp] | OPHTHALMIC | Status: DC | PRN

## 2015-02-17 MED ORDER — EYE WASH OPHTH SOLN
OPHTHALMIC | Status: AC
  Administered 2015-02-17: 1 [drp]
  Filled 2015-02-17: qty 118

## 2015-02-17 MED ORDER — TOBRAMYCIN 0.3 % OP SOLN
2.0000 [drp] | OPHTHALMIC | Status: DC
  Administered 2015-02-18: 2 [drp] via OPHTHALMIC
  Filled 2015-02-17: qty 5

## 2015-02-17 MED ORDER — OXYCODONE-ACETAMINOPHEN 5-325 MG PO TABS
1.0000 | ORAL_TABLET | Freq: Once | ORAL | Status: AC
  Administered 2015-02-17: 1 via ORAL

## 2015-02-17 MED ORDER — OXYCODONE-ACETAMINOPHEN 5-325 MG PO TABS
ORAL_TABLET | ORAL | Status: AC
  Administered 2015-02-17: 1 via ORAL
  Filled 2015-02-17: qty 1

## 2015-02-17 NOTE — ED Notes (Signed)
Called patient to take back to room , response

## 2015-02-17 NOTE — ED Notes (Signed)
Pt inquiring over the percocet strength; when notified, pt requests additional medication; st "I have had 3 babies and I know what I need to get a comfortable and one percocet won't do it; I'm not a drug seeker or anything, but if I can at least get a tylenol; pt then st "never mind, just tell her to come back in and do what she needs to do, I can take it, because I'm just ready to go back home"; PA notified

## 2015-02-17 NOTE — ED Notes (Signed)
Patient presents with c/o RIGHT eye pain. Patient advising that she walked into a branch this am. (+) tearing and pain that has been progressive. Vision is blurred.

## 2015-02-17 NOTE — ED Notes (Addendum)
Pt reports that she would like to have dilaudid for her pain but is able to take percocet with no side effects; st "if you want my pain to go away, give me dilaudid, if you want it to go away a little give me percocet; PA notified
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
## 2015-02-17 NOTE — Discharge Instructions (Signed)
Corneal Abrasion °The cornea is the clear covering at the front and center of the eye. When looking at the colored portion of the eye (iris), you are looking through the cornea. This very thin tissue is made up of many layers. The surface layer is a single layer of cells (corneal epithelium) and is one of the most sensitive tissues in the body. If a scratch or injury causes the corneal epithelium to come off, it is called a corneal abrasion. If the injury extends to the tissues below the epithelium, the condition is called a corneal ulcer. °CAUSES  °· Scratches. °· Trauma. °· Foreign body in the eye. °Some people have recurrences of abrasions in the area of the original injury even after it has healed (recurrent erosion syndrome). Recurrent erosion syndrome generally improves and goes away with time. °SYMPTOMS  °· Eye pain. °· Difficulty or inability to keep the injured eye open. °· The eye becomes very sensitive to light. °· Recurrent erosions tend to happen suddenly, first thing in the morning, usually after waking up and opening the eye. °DIAGNOSIS  °Your health care provider can diagnose a corneal abrasion during an eye exam. Dye is usually placed in the eye using a drop or a small paper strip moistened by your tears. When the eye is examined with a special light, the abrasion shows up clearly because of the dye. °TREATMENT  °· Small abrasions may be treated with antibiotic drops or ointment alone. °· A pressure patch may be put over the eye. If this is done, follow your doctor's instructions for when to remove the patch. Do not drive or use machines while the eye patch is on. Judging distances is hard to do with a patch on. °If the abrasion becomes infected and spreads to the deeper tissues of the cornea, a corneal ulcer can result. This is serious because it can cause corneal scarring. Corneal scars interfere with light passing through the cornea and cause a loss of vision in the involved eye. °HOME CARE  INSTRUCTIONS °· Use medicine or ointment as directed. Only take over-the-counter or prescription medicines for pain, discomfort, or fever as directed by your health care provider. °· Do not drive or operate machinery if your eye is patched. Your ability to judge distances is impaired. °· If your health care provider has given you a follow-up appointment, it is very important to keep that appointment. Not keeping the appointment could result in a severe eye infection or permanent loss of vision. If there is any problem keeping the appointment, let your health care provider know. °SEEK MEDICAL CARE IF:  °· You have pain, light sensitivity, and a scratchy feeling in one eye or both eyes. °· Your pressure patch keeps loosening up, and you can blink your eye under the patch after treatment. °· Any kind of discharge develops from the eye after treatment or if the lids stick together in the morning. °· You have the same symptoms in the morning as you did with the original abrasion days, weeks, or months after the abrasion healed. °MAKE SURE YOU:  °· Understand these instructions. °· Will watch your condition. °· Will get help right away if you are not doing well or get worse. °Document Released: 08/20/2000 Document Revised: 08/28/2013 Document Reviewed: 04/30/2013 °[employer]® Patient Information ©2015 [employer]. This information is not intended to replace advice given to you by your health care provider. Make sure you discuss any questions you have with your health care provider. ° °

## 2015-02-18 MED ORDER — ACETAMINOPHEN 325 MG PO TABS
650.0000 mg | ORAL_TABLET | Freq: Once | ORAL | Status: AC
  Administered 2015-02-18: 650 mg via ORAL

## 2015-02-18 MED ORDER — ACETAMINOPHEN 325 MG PO TABS
ORAL_TABLET | ORAL | Status: AC
  Administered 2015-02-18: 650 mg via ORAL
  Filled 2015-02-18: qty 2

## 2015-05-02 ENCOUNTER — Encounter: Payer: Self-pay | Admitting: Emergency Medicine

## 2015-05-02 ENCOUNTER — Emergency Department
Admission: EM | Admit: 2015-05-02 | Discharge: 2015-05-02 | Disposition: A | Discharge status: Home / Self Care | Payer: Medicaid Other | Attending: Emergency Medicine | Admitting: Emergency Medicine

## 2015-05-02 ENCOUNTER — Emergency Department: Payer: Medicaid Other

## 2015-05-02 DIAGNOSIS — R102 Pelvic and perineal pain: Secondary | ICD-10-CM

## 2015-05-02 DIAGNOSIS — O99331 Smoking (tobacco) complicating pregnancy, first trimester: Secondary | ICD-10-CM | POA: Insufficient documentation

## 2015-05-02 DIAGNOSIS — O039 Complete or unspecified spontaneous abortion without complication: Secondary | ICD-10-CM

## 2015-05-02 DIAGNOSIS — O209 Hemorrhage in early pregnancy, unspecified: Secondary | ICD-10-CM | POA: Diagnosis present

## 2015-05-02 DIAGNOSIS — A749 Chlamydial infection, unspecified: Secondary | ICD-10-CM | POA: Diagnosis not present

## 2015-05-02 DIAGNOSIS — F17210 Nicotine dependence, cigarettes, uncomplicated: Secondary | ICD-10-CM | POA: Insufficient documentation

## 2015-05-02 DIAGNOSIS — O035 Genital tract and pelvic infection following complete or unspecified spontaneous abortion: Secondary | ICD-10-CM | POA: Diagnosis not present

## 2015-05-02 DIAGNOSIS — N939 Abnormal uterine and vaginal bleeding, unspecified: Secondary | ICD-10-CM

## 2015-05-02 DIAGNOSIS — N719 Inflammatory disease of uterus, unspecified: Secondary | ICD-10-CM

## 2015-05-02 DIAGNOSIS — O98311 Other infections with a predominantly sexual mode of transmission complicating pregnancy, first trimester: Secondary | ICD-10-CM | POA: Diagnosis not present

## 2015-05-02 DIAGNOSIS — Z3A08 8 weeks gestation of pregnancy: Secondary | ICD-10-CM | POA: Insufficient documentation

## 2015-05-02 LAB — CBC WITH DIFFERENTIAL/PLATELET
BASOS ABS: 0.1 10*3/uL (ref 0–0.1)
BASOS PCT: 1 %
EOS PCT: 0 %
Eosinophils Absolute: 0 10*3/uL (ref 0–0.7)
HCT: 37.2 % (ref 35.0–47.0)
Hemoglobin: 12.7 g/dL (ref 12.0–16.0)
Lymphocytes Relative: 7 %
Lymphs Abs: 0.7 10*3/uL — ABNORMAL LOW (ref 1.0–3.6)
MCH: 30.1 pg (ref 26.0–34.0)
MCHC: 34.1 g/dL (ref 32.0–36.0)
MCV: 88.3 fL (ref 80.0–100.0)
Monocytes Absolute: 0.2 10*3/uL (ref 0.2–0.9)
Monocytes Relative: 2 %
NEUTROS PCT: 90 %
Neutro Abs: 10 10*3/uL — ABNORMAL HIGH (ref 1.4–6.5)
PLATELETS: 185 10*3/uL (ref 150–440)
RBC: 4.22 MIL/uL (ref 3.80–5.20)
RDW: 12.8 % (ref 11.5–14.5)
WBC: 11 10*3/uL (ref 3.6–11.0)

## 2015-05-02 LAB — COMPREHENSIVE METABOLIC PANEL
ALBUMIN: 4.2 g/dL (ref 3.5–5.0)
ALT: 12 U/L — AB (ref 14–54)
AST: 27 U/L (ref 15–41)
Alkaline Phosphatase: 52 U/L (ref 38–126)
Anion gap: 9 (ref 5–15)
BUN: 9 mg/dL (ref 6–20)
CHLORIDE: 102 mmol/L (ref 101–111)
CO2: 23 mmol/L (ref 22–32)
CREATININE: 0.73 mg/dL (ref 0.44–1.00)
Calcium: 9.1 mg/dL (ref 8.9–10.3)
GFR calc Af Amer: >60 mL/min (ref >60)
GFR calc non Af Amer: >60 mL/min (ref >60)
GLUCOSE: 117 mg/dL — AB (ref 65–99)
Potassium: 3.7 mmol/L (ref 3.5–5.1)
SODIUM: 134 mmol/L — AB (ref 135–145)
Total Bilirubin: 0.6 mg/dL (ref 0.3–1.2)
Total Protein: 7.1 g/dL (ref 6.5–8.1)

## 2015-05-02 LAB — HCG, QUANTITATIVE, PREGNANCY: HCG, BETA CHAIN, QUANT, S: 205 m[IU]/mL — AB (ref <5)

## 2015-05-02 LAB — URINALYSIS COMPLETE WITH MICROSCOPIC (ARMC ONLY)
BACTERIA UA: NONE SEEN
Bilirubin Urine: NEGATIVE
Glucose, UA: NEGATIVE mg/dL
Leukocytes, UA: NEGATIVE
Nitrite: NEGATIVE
PROTEIN: NEGATIVE mg/dL
SPECIFIC GRAVITY, URINE: 1.02 (ref 1.005–1.030)
pH: 8 (ref 5.0–8.0)

## 2015-05-02 LAB — WET PREP, GENITAL
Clue Cells Wet Prep HPF POC: NONE SEEN
Trich, Wet Prep: NONE SEEN
Yeast Wet Prep HPF POC: NONE SEEN

## 2015-05-02 LAB — CHLAMYDIA/NGC RT PCR (ARMC ONLY)
Chlamydia Tr: DETECTED — AB
N GONORRHOEAE: NOT DETECTED

## 2015-05-02 MED ORDER — GENTAMICIN SULFATE 40 MG/ML IJ SOLN: 2.5000 mg/kg | Freq: Once | INTRAVENOUS | Status: DC

## 2015-05-02 MED ORDER — HYDROMORPHONE HCL 1 MG/ML IJ SOLN
1.0000 mg | Freq: Once | INTRAMUSCULAR | Status: AC
  Administered 2015-05-02: 1 mg via INTRAVENOUS
  Filled 2015-05-02: qty 1

## 2015-05-02 MED ORDER — CLINDAMYCIN PHOSPHATE 600 MG/50ML IV SOLN
600.0000 mg | Freq: Once | INTRAVENOUS | Status: AC
Start: 2015-05-02 — End: 2015-05-02
  Administered 2015-05-02: 600 mg via INTRAVENOUS
  Filled 2015-05-02: qty 50

## 2015-05-02 MED ORDER — DOXYCYCLINE HYCLATE 100 MG PO CAPS: 100.0000 mg | ORAL_CAPSULE | Freq: Two times a day (BID) | ORAL | Status: DC

## 2015-05-02 MED ORDER — DIAZEPAM 5 MG/ML IJ SOLN
5.0000 mg | Freq: Once | INTRAMUSCULAR | Status: AC
  Administered 2015-05-02: 5 mg via INTRAVENOUS
  Filled 2015-05-02: qty 2

## 2015-05-02 MED ORDER — METRONIDAZOLE 500 MG PO TABS: 500.0000 mg | ORAL_TABLET | Freq: Two times a day (BID) | ORAL | Status: DC

## 2015-05-02 MED ORDER — ONDANSETRON HCL 4 MG/2ML IJ SOLN
4.0000 mg | Freq: Once | INTRAMUSCULAR | Status: AC
  Administered 2015-05-02: 4 mg via INTRAVENOUS

## 2015-05-02 MED ORDER — MORPHINE SULFATE (PF) 4 MG/ML IV SOLN: 4.0000 mg | Freq: Once | INTRAVENOUS | Status: DC

## 2015-05-02 MED ORDER — GENTAMICIN SULFATE 40 MG/ML IJ SOLN
2.5000 mg/kg | Freq: Once | INTRAVENOUS | Status: AC
  Administered 2015-05-02: 120 mg via INTRAVENOUS
  Filled 2015-05-02: qty 3

## 2015-05-02 MED ORDER — ACETAMINOPHEN 500 MG PO TABS
ORAL_TABLET | ORAL | Status: AC
  Filled 2015-05-02: qty 2

## 2015-05-02 MED ORDER — AZITHROMYCIN 250 MG PO TABS
1000.0000 mg | ORAL_TABLET | Freq: Once | ORAL | Status: AC
  Administered 2015-05-02: 1000 mg via ORAL
  Filled 2015-05-02: qty 4

## 2015-05-02 MED ORDER — MORPHINE SULFATE (PF) 4 MG/ML IV SOLN
4.0000 mg | Freq: Once | INTRAVENOUS | Status: AC
  Administered 2015-05-02: 4 mg via INTRAVENOUS
  Filled 2015-05-02: qty 1

## 2015-05-02 MED ORDER — GENTAMICIN IN SALINE 1.2 MG/ML IV SOLN: 120.0000 mg | Freq: Once | INTRAVENOUS | Status: DC

## 2015-05-02 MED ORDER — ACETAMINOPHEN 500 MG PO TABS
1000.0000 mg | ORAL_TABLET | Freq: Once | ORAL | Status: AC
  Administered 2015-05-02: 1000 mg via ORAL

## 2015-05-02 MED ORDER — HYDROCODONE-ACETAMINOPHEN 5-325 MG PO TABS: 1.0000 | ORAL_TABLET | Freq: Four times a day (QID) | ORAL | Status: DC | PRN

## 2015-05-02 MED ORDER — MORPHINE SULFATE (PF) 2 MG/ML IV SOLN
2.0000 mg | Freq: Once | INTRAVENOUS | Status: AC
  Administered 2015-05-02: 2 mg via INTRAVENOUS

## 2015-05-02 MED ORDER — MISOPROSTOL 200 MCG PO TABS
600.0000 ug | ORAL_TABLET | Freq: Once | ORAL | Status: AC
  Administered 2015-05-02: 600 ug via ORAL
  Filled 2015-05-02: qty 3

## 2015-05-02 MED ORDER — SODIUM CHLORIDE 0.9 % IV SOLN
Freq: Once | INTRAVENOUS | Status: AC
  Administered 2015-05-02: 08:00:00 via INTRAVENOUS

## 2015-05-02 NOTE — ED Notes (Signed)
Pt presents with fever and vaginal bleeding. Pt states she had a miscarriage on June 19th, never saw a  Tax adviser after miscarriage.
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
## 2015-05-02 NOTE — ED Notes (Signed)
Informed Dr Jimmye Norman of elevated temp, ordered to give Tylenol then DC home.  Informed pt and family to give tylenol and motrin at alternating times every 4 hrs and 6 hrs respectively.

## 2015-05-02 NOTE — Discharge Instructions (Signed)
Endometritis Endometritis is an irritation, soreness, and swelling (inflammation) of the lining of the uterus (endometrium).  CAUSES   Bacterial infections.  Sexually transmitted infections (STIs).  Having a miscarriage or childbirth, especially after a long labor or cesarean delivery.  Certain gynecological procedures (such as dilation and curettage, hysteroscopy, or contraceptive insertion). SIGNS AND SYMPTOMS   Fever.  Lower abdominal or pelvic pain.  Abnormal vaginal discharge or bleeding.  Abdominal bloating (distention) or swelling.  General discomfort or ill feeling.  Discomfort with bowel movements. DIAGNOSIS  A physical and pelvic exam are performed. Other tests may include:  Cultures from the cervix.  Blood tests.  Examining a tissue sample of the uterine lining (endometrial biopsy).  Examining discharge under a microscope (wet prep).  Laparoscopy. TREATMENT  Antibiotic medicines are usually given. Other treatments may include:  Fluids through an IV tube inserted in your vein.  Rest. HOME CARE INSTRUCTIONS   Take over-the-counter or prescription medicines for pain, discomfort, or fever as directed by your health care provider.  Take your antibiotics as directed. Finish them even if you start to feel better.  Resume your normal diet and activities as directed or as tolerated.  Do not douche or have sexual intercourse until your health care provider says it is okay.  Do not have sexual intercourse until your partner has been treated if your endometritis is caused by an STI. SEEK IMMEDIATE MEDICAL CARE IF:   You have swelling or increasing pain in the abdomen.  You have a fever.  You have bad smelling vaginal discharge, or you have an increased amount of discharge.  You have abnormal vaginal bleeding.  Your medicine is not helping with the pain.  You experience any problems that may be related to the medicine you are taking.  You have nausea  and vomiting, or you cannot keep foods down.  You have pain with bowel movements. MAKE SURE YOU:   Understand these instructions.  Will watch your condition.  Will get help right away if you are not doing well or get worse. Document Released: 08/17/2001 Document Revised: 04/25/2013 Document Reviewed: 03/22/2013 [facility] Patient Information 2015 [place], [place]. This information is not intended to replace advice given to you by your health care provider. Make sure you discuss any questions you have with your health care provider.  Chlamydia Chlamydia is an infection. It is spread through sexual contact. Chlamydia can be in different areas of the body. These areas include the cervix, urethra, throat, or rectum. You may not know you have chlamydia because many people never develop the symptoms. Chlamydia is not difficult to treat once you know you have it. However, if it is left untreated, chlamydia can lead to more serious health problems.  CAUSES  Chlamydia is caused by bacteria. It is a sexually transmitted disease. It is passed from an infected partner during intimate contact. This contact could be with the genitals, mouth, or rectal area. Chlamydia can also be passed from mothers to babies during birth. SIGNS AND SYMPTOMS  There may not be any symptoms. This is often the case early in the infection. If symptoms develop, they may include:  Mild pain and discomfort when urinating.  Redness, soreness, and swelling (inflammation) of the rectum.  Vaginal discharge.  Painful intercourse.  Abdominal pain.  Bleeding between menstrual periods. DIAGNOSIS  To diagnose this infection, your health care provider will do a pelvic exam. Cultures will be taken of the vagina, cervix, urine, and possibly the rectum to verify the  diagnosis.  TREATMENT You will be given antibiotic medicines. If you are pregnant, certain types of antibiotics will need to be avoided. Any sexual partners should also be  treated, even if they do not show symptoms.  HOME CARE INSTRUCTIONS   Take your antibiotic medicine as directed by your health care provider. Finish the antibiotic even if you start to feel better.  Take medicines only as directed by your health care provider.  Inform any sexual partners about the infection. They should also be treated.  Do not have sexual contact until your health care provider tells you it is okay.  Get plenty of rest.  Eat a well-balanced diet.  Drink enough fluids to keep your urine clear or pale yellow.  Keep all follow-up visits as directed by your health care provider. SEEK MEDICAL CARE IF:  You have painful urination.  You have abdominal pain.  You have vaginal discharge.  You have painful sexual intercourse.  You have bleeding between periods and after sex.  You have a fever. SEEK IMMEDIATE MEDICAL CARE IF:   You experience nausea or vomiting.  You experience excessive sweating (diaphoresis).  You have difficulty swallowing. MAKE SURE YOU:   Understand these instructions.  Will watch your condition.  Will get help right away if you are not doing well or get worse. Document Released: 06/02/2005 Document Revised: 01/07/2014 Document Reviewed: 04/30/2013 [facility] Patient Information 2015 [place], [place]. This information is not intended to replace advice given to you by your health care provider. Make sure you discuss any questions you have with your health care provider.  Miscarriage A miscarriage is the sudden loss of an unborn baby (fetus) before the 20th week of pregnancy. Most miscarriages happen in the first 3 months of pregnancy. Sometimes, it happens before a woman even knows she is pregnant. A miscarriage is also called a "spontaneous miscarriage" or "early pregnancy loss." Having a miscarriage can be an emotional experience. Talk with your caregiver about any questions you may have about miscarrying, the grieving process, and your  future pregnancy plans. CAUSES   Problems with the fetal chromosomes that make it impossible for the baby to develop normally. Problems with the baby's genes or chromosomes are most often the result of errors that occur, by chance, as the embryo divides and grows. The problems are not inherited from the parents.  Infection of the cervix or uterus.   Hormone problems.   Problems with the cervix, such as having an incompetent cervix. This is when the tissue in the cervix is not strong enough to hold the pregnancy.   Problems with the uterus, such as an abnormally shaped uterus, uterine fibroids, or congenital abnormalities.   Certain medical conditions.   Smoking, drinking alcohol, or taking illegal drugs.   Trauma.  Often, the cause of a miscarriage is unknown.  SYMPTOMS   Vaginal bleeding or spotting, with or without cramps or pain.  Pain or cramping in the abdomen or lower back.  Passing fluid, tissue, or blood clots from the vagina. DIAGNOSIS  Your caregiver will perform a physical exam. You may also have an ultrasound to confirm the miscarriage. Blood or urine tests may also be ordered. TREATMENT   Sometimes, treatment is not necessary if you naturally pass all the fetal tissue that was in the uterus. If some of the fetus or placenta remains in the body (incomplete miscarriage), tissue left behind may become infected and must be removed. Usually, a dilation and curettage (D and C) procedure is  performed. During a D and C procedure, the cervix is widened (dilated) and any remaining fetal or placental tissue is gently removed from the uterus.  Antibiotic medicines are prescribed if there is an infection. Other medicines may be given to reduce the size of the uterus (contract) if there is a lot of bleeding.  If you have Rh negative blood and your baby was Rh positive, you will need a Rh immunoglobulin shot. This shot will protect any future baby from having Rh blood problems  in future pregnancies. HOME CARE INSTRUCTIONS   Your caregiver may order bed rest or may allow you to continue light activity. Resume activity as directed by your caregiver.  Have someone help with home and family responsibilities during this time.   Keep track of the number of sanitary pads you use each day and how soaked (saturated) they are. Write down this information.   Do not use tampons. Do not douche or have sexual intercourse until approved by your caregiver.   Only take over-the-counter or prescription medicines for pain or discomfort as directed by your caregiver.   Do not take aspirin. Aspirin can cause bleeding.   Keep all follow-up appointments with your caregiver.   If you or your partner have problems with grieving, talk to your caregiver or seek counseling to help cope with the pregnancy loss. Allow enough time to grieve before trying to get pregnant again.  SEEK IMMEDIATE MEDICAL CARE IF:   You have severe cramps or pain in your back or abdomen.  You have a fever.  You pass large blood clots (walnut-sized or larger) ortissue from your vagina. Save any tissue for your caregiver to inspect.   Your bleeding increases.   You have a thick, bad-smelling vaginal discharge.  You become lightheaded, weak, or you faint.   You have chills.  MAKE SURE YOU:  Understand these instructions.  Will watch your condition.  Will get help right away if you are not doing well or get worse. Document Released: 02/16/2001 Document Revised: 12/18/2012 Document Reviewed: 10/12/2011 [facility] Patient Information 2015 [place], [place]. This information is not intended to replace advice given to you by your health care provider. Make sure you discuss any questions you have with your health care provider.

## 2015-05-05 ENCOUNTER — Telehealth: Payer: Self-pay | Admitting: Emergency Medicine

## 2015-05-05 NOTE — ED Notes (Signed)
Left message asking patient to call me.  Would like to inform her of positive chlamydia test and need for partner treatment.  Patient was treated in the ED.

## 2015-05-06 ENCOUNTER — Encounter: Payer: Self-pay | Admitting: Obstetrics and Gynecology

## 2015-05-06 ENCOUNTER — Ambulatory Visit (INDEPENDENT_AMBULATORY_CARE_PROVIDER_SITE_OTHER): Payer: Medicaid Other | Admitting: Obstetrics and Gynecology

## 2015-05-06 ENCOUNTER — Ambulatory Visit: Payer: Self-pay | Admitting: Obstetrics and Gynecology

## 2015-05-06 ENCOUNTER — Observation Stay
Admit: 2015-05-06 | Discharge: 2015-05-07 | Disposition: A | Discharge status: Home / Self Care | Payer: Medicaid Other | Attending: Obstetrics and Gynecology | Admitting: Obstetrics and Gynecology

## 2015-05-06 ENCOUNTER — Ambulatory Visit: Payer: Medicaid Other

## 2015-05-06 ENCOUNTER — Other Ambulatory Visit: Payer: Self-pay | Admitting: Obstetrics and Gynecology

## 2015-05-06 VITALS — BP 99/64 | HR 101 | Ht 64.0 in | Wt 106.9 lb

## 2015-05-06 DIAGNOSIS — M1990 Unspecified osteoarthritis, unspecified site: Secondary | ICD-10-CM | POA: Diagnosis not present

## 2015-05-06 DIAGNOSIS — I340 Nonrheumatic mitral (valve) insufficiency: Secondary | ICD-10-CM | POA: Insufficient documentation

## 2015-05-06 DIAGNOSIS — Z8543 Personal history of malignant neoplasm of ovary: Secondary | ICD-10-CM | POA: Diagnosis not present

## 2015-05-06 DIAGNOSIS — D649 Anemia, unspecified: Secondary | ICD-10-CM | POA: Insufficient documentation

## 2015-05-06 DIAGNOSIS — N719 Inflammatory disease of uterus, unspecified: Secondary | ICD-10-CM | POA: Insufficient documentation

## 2015-05-06 DIAGNOSIS — M549 Dorsalgia, unspecified: Secondary | ICD-10-CM | POA: Insufficient documentation

## 2015-05-06 DIAGNOSIS — G8929 Other chronic pain: Secondary | ICD-10-CM | POA: Insufficient documentation

## 2015-05-06 DIAGNOSIS — F419 Anxiety disorder, unspecified: Secondary | ICD-10-CM | POA: Diagnosis not present

## 2015-05-06 DIAGNOSIS — F909 Attention-deficit hyperactivity disorder, unspecified type: Secondary | ICD-10-CM | POA: Insufficient documentation

## 2015-05-06 DIAGNOSIS — Z8659 Personal history of other mental and behavioral disorders: Secondary | ICD-10-CM | POA: Insufficient documentation

## 2015-05-06 DIAGNOSIS — O039 Complete or unspecified spontaneous abortion without complication: Secondary | ICD-10-CM | POA: Diagnosis not present

## 2015-05-06 DIAGNOSIS — R102 Pelvic and perineal pain: Secondary | ICD-10-CM | POA: Insufficient documentation

## 2015-05-06 DIAGNOSIS — O034 Incomplete spontaneous abortion without complication: Secondary | ICD-10-CM

## 2015-05-06 DIAGNOSIS — F17200 Nicotine dependence, unspecified, uncomplicated: Secondary | ICD-10-CM | POA: Insufficient documentation

## 2015-05-06 DIAGNOSIS — O021 Missed abortion: Secondary | ICD-10-CM | POA: Diagnosis present

## 2015-05-06 DIAGNOSIS — N710 Acute inflammatory disease of uterus: Secondary | ICD-10-CM | POA: Diagnosis present

## 2015-05-06 MED ORDER — VALACYCLOVIR HCL 1 G PO TABS: 1000.0000 mg | ORAL_TABLET | Freq: Two times a day (BID) | ORAL | Status: DC

## 2015-05-06 MED ORDER — IBUPROFEN 800 MG PO TABS: 800.0000 mg | ORAL_TABLET | Freq: Three times a day (TID) | ORAL | Status: DC | PRN

## 2015-05-06 MED ORDER — DEXTROSE IN LACTATED RINGERS 5 % IV SOLN
INTRAVENOUS | Status: DC
  Administered 2015-05-06 – 2015-05-07 (×2): via INTRAVENOUS

## 2015-05-06 MED ORDER — PRENATAL MULTIVITAMIN CH: 1.0000 | ORAL_TABLET | Freq: Every day | ORAL | Status: DC

## 2015-05-06 MED ORDER — GENTAMICIN SULFATE 40 MG/ML IJ SOLN
5.0000 mg/kg | INTRAVENOUS | Status: DC
  Administered 2015-05-06: 240 mg via INTRAVENOUS
  Filled 2015-05-06 (×2): qty 6

## 2015-05-06 MED ORDER — ACETAMINOPHEN 325 MG PO TABS: 650.0000 mg | ORAL_TABLET | ORAL | Status: DC | PRN

## 2015-05-06 MED ORDER — ONDANSETRON HCL 4 MG PO TABS: 4.0000 mg | ORAL_TABLET | ORAL | Status: DC | PRN

## 2015-05-06 MED ORDER — HYDROCODONE-ACETAMINOPHEN 5-300 MG PO TABS: 5.0000 mg | ORAL_TABLET | Freq: Four times a day (QID) | ORAL | Status: DC | PRN

## 2015-05-06 MED ORDER — CLINDAMYCIN PHOSPHATE 900 MG/50ML IV SOLN
900.0000 mg | Freq: Three times a day (TID) | INTRAVENOUS | Status: DC
  Administered 2015-05-06 – 2015-05-07 (×2): 900 mg via INTRAVENOUS
  Filled 2015-05-06 (×6): qty 50

## 2015-05-06 MED ORDER — HYDROCODONE-ACETAMINOPHEN 5-325 MG PO TABS
1.0000 | ORAL_TABLET | ORAL | Status: DC | PRN
  Administered 2015-05-06 – 2015-05-07 (×4): 2 via ORAL
  Filled 2015-05-06 (×4): qty 2
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
## 2015-05-06 NOTE — Patient Instructions (Signed)
1.  Return for admission this evening at 2000 hrs. 2.  Patient is to receive IV antibiotics overnight. 3.  Patient is to have suction D&C on 05/07/2015.
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
## 2015-05-07 ENCOUNTER — Observation Stay: Payer: Medicaid Other | Admitting: Anesthesiology

## 2015-05-07 ENCOUNTER — Encounter: Payer: Self-pay | Admitting: Anesthesiology

## 2015-05-07 ENCOUNTER — Ambulatory Visit
Admission: RE | Admit: 2015-05-07 | Payer: Medicaid Other | Source: Ambulatory Visit | Admitting: Obstetrics and Gynecology

## 2015-05-07 ENCOUNTER — Encounter: Disposition: A | Discharge status: Home / Self Care | Payer: Self-pay | Attending: Obstetrics and Gynecology

## 2015-05-07 DIAGNOSIS — O021 Missed abortion: Principal | ICD-10-CM

## 2015-05-07 DIAGNOSIS — O034 Incomplete spontaneous abortion without complication: Secondary | ICD-10-CM | POA: Diagnosis present

## 2015-05-07 HISTORY — PX: DILATION AND EVACUATION: SHX1459

## 2015-05-07 LAB — CBC WITH DIFFERENTIAL/PLATELET
BASOS ABS: 0.1 10*3/uL (ref 0–0.1)
Basophils Relative: 1 %
EOS PCT: 3 %
Eosinophils Absolute: 0.2 10*3/uL (ref 0–0.7)
HCT: 35.2 % (ref 35.0–47.0)
Hemoglobin: 11.5 g/dL — ABNORMAL LOW (ref 12.0–16.0)
LYMPHS ABS: 4.1 10*3/uL — AB (ref 1.0–3.6)
LYMPHS PCT: 43 %
MCH: 29.1 pg (ref 26.0–34.0)
MCHC: 32.7 g/dL (ref 32.0–36.0)
MCV: 89 fL (ref 80.0–100.0)
MONO ABS: 0.8 10*3/uL (ref 0.2–0.9)
Monocytes Relative: 9 %
Neutro Abs: 4.2 10*3/uL (ref 1.4–6.5)
Neutrophils Relative %: 44 %
PLATELETS: 239 10*3/uL (ref 150–440)
RBC: 3.96 MIL/uL (ref 3.80–5.20)
RDW: 13.4 % (ref 11.5–14.5)
WBC: 9.4 10*3/uL (ref 3.6–11.0)

## 2015-05-07 LAB — CREATININE, SERUM
Creatinine, Ser: 0.77 mg/dL (ref 0.44–1.00)
GFR calc Af Amer: >60 mL/min (ref >60)
GFR calc non Af Amer: >60 mL/min (ref >60)

## 2015-05-07 SURGERY — DILATION AND EVACUATION, UTERUS
Anesthesia: General | Wound class: Clean Contaminated

## 2015-05-07 MED ORDER — EPHEDRINE SULFATE 50 MG/ML IJ SOLN
INTRAMUSCULAR | Status: DC | PRN
  Administered 2015-05-07: 10 mg via INTRAVENOUS

## 2015-05-07 MED ORDER — MIDAZOLAM HCL 5 MG/5ML IJ SOLN
INTRAMUSCULAR | Status: DC | PRN
  Administered 2015-05-07: 2 mg via INTRAVENOUS

## 2015-05-07 MED ORDER — ONDANSETRON HCL 4 MG/2ML IJ SOLN
INTRAMUSCULAR | Status: DC | PRN
  Administered 2015-05-07: 4 mg via INTRAVENOUS

## 2015-05-07 MED ORDER — FENTANYL CITRATE (PF) 100 MCG/2ML IJ SOLN
INTRAMUSCULAR | Status: DC | PRN
  Administered 2015-05-07: 50 ug via INTRAVENOUS

## 2015-05-07 MED ORDER — FENTANYL CITRATE (PF) 100 MCG/2ML IJ SOLN
25.0000 ug | INTRAMUSCULAR | Status: DC | PRN
  Administered 2015-05-07 (×4): 25 ug via INTRAVENOUS

## 2015-05-07 MED ORDER — DEXAMETHASONE SODIUM PHOSPHATE 10 MG/ML IJ SOLN
INTRAMUSCULAR | Status: DC | PRN
Start: 2015-05-07 — End: 2015-05-07
  Administered 2015-05-07: 10 mg via INTRAVENOUS

## 2015-05-07 MED ORDER — LIDOCAINE HCL (CARDIAC) 20 MG/ML IV SOLN
INTRAVENOUS | Status: DC | PRN
  Administered 2015-05-07: 60 mg via INTRAVENOUS

## 2015-05-07 MED ORDER — ONDANSETRON HCL 4 MG/2ML IJ SOLN: 4.0000 mg | Freq: Once | INTRAMUSCULAR | Status: DC | PRN

## 2015-05-07 MED ORDER — LACTATED RINGERS IV SOLN
INTRAVENOUS | Status: DC | PRN
  Administered 2015-05-07: 13:00:00 via INTRAVENOUS

## 2015-05-07 MED ORDER — PROPOFOL 10 MG/ML IV BOLUS
INTRAVENOUS | Status: DC | PRN
  Administered 2015-05-07: 150 mg via INTRAVENOUS

## 2015-05-07 MED ORDER — METOCLOPRAMIDE HCL 5 MG/ML IJ SOLN
INTRAMUSCULAR | Status: DC | PRN
  Administered 2015-05-07: 10 mg via INTRAVENOUS

## 2015-05-07 MED ORDER — IBUPROFEN 800 MG PO TABS: 800.0000 mg | ORAL_TABLET | Freq: Three times a day (TID) | ORAL | Status: DC

## 2015-05-07 MED ORDER — HYDROCODONE-ACETAMINOPHEN 5-325 MG PO TABS: 1.0000 | ORAL_TABLET | ORAL | Status: DC | PRN

## 2015-05-07 SURGICAL SUPPLY — 19 items
CATH ROBINSON RED A/P 16FR (CATHETERS) ×3 IMPLANT
DRSG TELFA 3X8 NADH (GAUZE/BANDAGES/DRESSINGS) IMPLANT
FILTER UTR ASPR SPEC (MISCELLANEOUS) IMPLANT
FLTR UTR ASPR SPEC (MISCELLANEOUS)
GLOVE BIO SURGEON STRL SZ8 (GLOVE) ×15 IMPLANT
GOWN STRL REUS W/ TWL LRG LVL3 (GOWN DISPOSABLE) ×2 IMPLANT
GOWN STRL REUS W/TWL LRG LVL3 (GOWN DISPOSABLE) ×4
KIT BERKELEY 1ST TRIMESTER 3/8 (MISCELLANEOUS) IMPLANT
KIT RM TURNOVER CYSTO AR (KITS) ×3 IMPLANT
PACK DNC HYST (MISCELLANEOUS) ×3 IMPLANT
PAD OB MATERNITY 4.3X12.25 (PERSONAL CARE ITEMS) ×3 IMPLANT
PAD PREP 24X41 OB/GYN DISP (PERSONAL CARE ITEMS) ×3 IMPLANT
SET BERKELEY SUCTION TUBING (SUCTIONS) ×3 IMPLANT
SOL PREP PVP 2OZ (MISCELLANEOUS) ×3
SOLUTION PREP PVP 2OZ (MISCELLANEOUS) ×1 IMPLANT
SPONGE XRAY 4X4 16PLY STRL (MISCELLANEOUS) IMPLANT
TOWEL OR 17X26 4PK STRL BLUE (TOWEL DISPOSABLE) ×3 IMPLANT
VACURETTE 10 RIGID CVD (CANNULA) IMPLANT
VACURETTE 8 RIGID CVD (CANNULA) ×3 IMPLANT
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
## 2015-05-07 NOTE — Transfer of Care (Signed)
Immediate Anesthesia Transfer of Care Note  Patient: Margaret Washington  Procedure(s) Performed: Procedure(s): DILATATION AND EVACUATION (N/A)  Patient Location: PACU  Anesthesia Type:General  Level of Consciousness: sedated  Airway & Oxygen Therapy: Patient Spontanous Breathing and Patient connected to face mask oxygen  Post-op Assessment: Report given to RN and Post -op Vital signs reviewed and stable  Post vital signs: Reviewed and stable  Last Vitals: 2:04pm - 100% 94/58 12resp 66hr 97.1temp Filed Vitals:   05/07/15 1115  BP: 90/58  Pulse: 66  Temp: 36.9 C  Resp: 18    Complications: No apparent anesthesia complications

## 2015-05-07 NOTE — Op Note (Signed)
[facility] PROCEDURE DATE: 05/06/2015 - 05/07/2015 1:58 PM  PREOPERATIVE DIAGNOSIS: Retained POC; Endometritis POSTOPERATIVE DIAGNOSIS: Same PROCEDURE: Procedure(s): DILATATION AND EVACUATION (N/A) SURGEON:  Dr. Hassell Done A Margaret Washington ASSISTANT: None ANESTHESIA: General  INDICATIONS: 39 y.o. [id number] with history ofIncomplete AB at ? weeks gestation presents for surgical management.   Please see preoperative notes for further details. Also with endometritis treated with IV  And PO antibiotics.  FINDINGS:  Uterus was Midplane and 10 week size. Tissue consistent with products of conception was removed and sent to Pathology.   I/O's:  200 ml IVF; 50 mL EBL; 100 mL Urine SPECIMENS: POC  COMPLICATIONS: None immediate COUNTS:  YES  PROCEDURE IN DETAIL: The patient was brought to the operating room where she was placed into the supine position.  General LMA anesthesia was induced without incident. She was placed in the dorsal lithotomy position using candy cane stirrups, and was examined; A Betadine prep and drape was performed in sterile fashion.   A  Red Robinson catheter was used to drain the bladder of urine. A weighted speculum was placed into  the vagina and a single tooth tenaculum was applied to the anterior lip of the cervix. The cervix was gently dilated using Hank's Dilators to accommodate a 10 Fr suction curette, that was gently advanced to the uterine fundus.  The suction device was then activated and the curette was slowly rotated to clear the uterine cavity of products of conception.  A sharp curettage with a serrated curette  was then performed to confirm complete emptying of the uterus. Minimal bleeding was encountered. The tenaculum was removed along with all instruments  from the  vagina.   The patient was awakened, mobilized and taken to the recovery room in satisfactory condition.  The procedure was well-tolerated.  The patient will be discharged to home as per PACU criteria.   Routine postoperative instructions were given along with a prescription for analgesics.  She will follow up in the clinic in 1-2 weeks for postoperative evaluation.  Brayton Mars, MD [facility]

## 2015-05-07 NOTE — Anesthesia Preprocedure Evaluation (Addendum)
Anesthesia Evaluation  Patient identified by MRN, date of birth, ID band Patient awake    Reviewed: Allergy & Precautions, NPO status   Airway Mallampati: II  TM Distance: >3 FB Neck ROM: Full    Dental no notable dental hx. (+) Poor Dentition   Pulmonary Current Smoker,    Pulmonary exam normal       Cardiovascular Normal cardiovascular exam+ Valvular Problems/Murmurs MR     Neuro/Psych Anxiety ADHD   GI/Hepatic negative GI ROS, Neg liver ROS,   Endo/Other  negative endocrine ROS  Renal/GU negative Renal ROS     Musculoskeletal  (+) Arthritis -, Osteoarthritis,  Back pain   Abdominal Normal abdominal exam  (+)   Peds  Hematology  (+) anemia ,   Anesthesia Other Findings   Reproductive/Obstetrics                            Anesthesia Physical Anesthesia Plan  ASA: II  Anesthesia Plan: General   Post-op Pain Management:    Induction: Intravenous  Airway Management Planned: LMA  Additional Equipment:   Intra-op Plan:   Post-operative Plan: Extubation in OR  Informed Consent: I have reviewed the patients History and Physical, chart, labs and discussed the procedure including the risks, benefits and alternatives for the proposed anesthesia with the patient or authorized representative who has indicated his/her understanding and acceptance.   Dental advisory given  Plan Discussed with: CRNA and Surgeon  Anesthesia Plan Comments:         Anesthesia Quick Evaluation

## 2015-05-07 NOTE — Anesthesia Postprocedure Evaluation (Signed)
  Anesthesia Post-op Note  Patient: Margaret Washington  Procedure(s) Performed: Procedure(s): DILATATION AND EVACUATION (N/A)  Anesthesia type:General  Patient location: PACU  Post pain: Pain level controlled  Post assessment: Post-op Vital signs reviewed, Patient's Cardiovascular Status Stable, Respiratory Function Stable, Patent Airway and No signs of Nausea or vomiting  Post vital signs: Reviewed and stable  Last Vitals:  Filed Vitals:   05/07/15 1446  BP: 94/54  Pulse: 62  Temp:   Resp: 13    Level of consciousness: awake, alert  and patient cooperative  Complications: No apparent anesthesia complications

## 2015-05-07 NOTE — Anesthesia Procedure Notes (Signed)
Procedure Name: LMA Insertion Date/Time: 05/07/2015 1:28 PM Performed by: Delaney Meigs Pre-anesthesia Checklist: Patient identified, Patient being monitored, Timeout performed, Emergency Drugs available and Suction available Patient Re-evaluated:Patient Re-evaluated prior to inductionOxygen Delivery Method: Circle system utilized Preoxygenation: Pre-oxygenation with 100% oxygen Intubation Type: IV induction Ventilation: Mask ventilation without difficulty LMA: LMA inserted LMA Size: 3.5 Tube type: Oral Number of attempts: 1 Placement Confirmation: positive ETCO2 and breath sounds checked- equal and bilateral Tube secured with: Tape Dental Injury: Teeth and Oropharynx as per pre-operative assessment

## 2015-05-07 NOTE — Progress Notes (Signed)
All discharge instructions given to patient and she voices understanding of all instructions given. Iv d/c'd , patient tolerated well, site benign, prescriptions given, patient already has f/u appt made and is aware of date and time.  Patient discharged home with spouse escorted out auxillary in wheelchair.

## 2015-05-08 LAB — SURGICAL PATHOLOGY

## 2015-05-10 ENCOUNTER — Encounter: Payer: Self-pay | Admitting: Emergency Medicine

## 2015-05-10 ENCOUNTER — Emergency Department
Admission: EM | Admit: 2015-05-10 | Discharge: 2015-05-10 | Disposition: A | Discharge status: Home / Self Care | Payer: Medicaid Other | Attending: Emergency Medicine | Admitting: Emergency Medicine

## 2015-05-10 DIAGNOSIS — Z720 Tobacco use: Secondary | ICD-10-CM | POA: Insufficient documentation

## 2015-05-10 DIAGNOSIS — Z792 Long term (current) use of antibiotics: Secondary | ICD-10-CM | POA: Insufficient documentation

## 2015-05-10 DIAGNOSIS — Z791 Long term (current) use of non-steroidal anti-inflammatories (NSAID): Secondary | ICD-10-CM | POA: Diagnosis not present

## 2015-05-10 DIAGNOSIS — Z79899 Other long term (current) drug therapy: Secondary | ICD-10-CM | POA: Insufficient documentation

## 2015-05-10 DIAGNOSIS — Z760 Encounter for issue of repeat prescription: Secondary | ICD-10-CM | POA: Diagnosis not present

## 2015-05-10 MED ORDER — OXYCODONE-ACETAMINOPHEN 5-325 MG PO TABS: 1.0000 | ORAL_TABLET | ORAL | Status: DC | PRN
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
## 2015-05-10 NOTE — ED Notes (Signed)
Pt states she just had surgery and her MD did not sign the script for vicodin so she couldn't get it filled

## 2015-05-10 NOTE — Discharge Instructions (Signed)
Medication Refill, Emergency Department °We have refilled your medication today as a courtesy to you. It is best for your medical care, however, to take care of getting refills done through your primary caregiver's office. They have your records and can do a better job of follow-up than we can in the emergency department. °On maintenance medications, we often only prescribe enough medications to get you by until you are able to see your regular caregiver. This is a more expensive way to refill medications. °In the future, please plan for refills so that you will not have to use the emergency department for this. °Thank you for your help. Your help allows us to better take care of the daily emergencies that enter our department. °Document Released: 12/10/2003 Document Revised: 11/15/2011 Document Reviewed: 11/30/2013 °ExitCare® Patient Information ©2015 [employer]. This information is not intended to replace advice given to you by your health care provider. Make sure you discuss any questions you have with your health care provider. ° °

## 2015-05-10 NOTE — ED Notes (Signed)
Pt to ed with co/o of wanting pain meds refilled.  States she had surgery on 8/31 and the RX that dr defransceco gave her was not signed.  Pt reports she is now out of meds and needs a refill on vicodin.

## 2015-05-11 NOTE — Discharge Summary (Signed)
Patient Information    Patient Name Sex DOB SSN   Margaret Washington, Margaret Washington Female September 11, 1975 [id number]    Op Note by Brayton Mars, MD at 05/07/2015 1:58 PM    Author: Brayton Mars, MD Service: Obstetrics/Gynecology Author Type: Physician   Filed: 05/07/2015 2:01 PM Note Time: 05/07/2015 1:58 PM Status: Signed   Editor: Brayton Mars, MD (Physician)     Expand All Collapse All   [facility] PROCEDURE DATE: 05/06/2015 - 05/07/2015 1:58 PM  PREOPERATIVE DIAGNOSIS: Retained POC; Endometritis POSTOPERATIVE DIAGNOSIS: Same PROCEDURE: Procedure(s): DILATATION AND EVACUATION (N/A) SURGEON: Dr. Hassell Done A Odas Ozer ASSISTANT: None ANESTHESIA: General  INDICATIONS: 39 y.o. [id number] with history ofIncomplete AB at ? weeks gestation presents for surgical management. Please see preoperative notes for further details. Also with endometritis treated with IV And PO antibiotics.  FINDINGS: Uterus was Midplane and 10 week size. Tissue consistent with products of conception was removed and sent to Pathology.   I/O's:  200 ml IVF; 50 mL EBL; 100 mL Urine SPECIMENS: POC  COMPLICATIONS: None immediate COUNTS: YES  PROCEDURE IN DETAIL: The patient was brought to the operating room where she was placed into the supine position. General LMA anesthesia was induced without incident. She was placed in the dorsal lithotomy position using candy cane stirrups, and was examined; A Betadine prep and drape was performed in sterile fashion. A Red Robinson catheter was used to drain the bladder of urine. A weighted speculum was placed into the vagina and a single tooth tenaculum was applied to the anterior lip of the cervix. The cervix was gently dilated using Hank's Dilators to accommodate a 10 Fr suction curette, that was gently advanced to the uterine fundus. The suction device was then activated and the curette was slowly rotated to clear the uterine cavity of products of  conception. A sharp curettage with a serrated curette was then performed to confirm complete emptying of the uterus. Minimal bleeding was encountered. The tenaculum was removed along with all instruments from the vagina. The patient was awakened, mobilized and taken to the recovery room in satisfactory condition. The procedure was well-tolerated.  The patient will be discharged to home as per PACU criteria. Routine postoperative instructions were given along with a prescription for analgesics. She will follow up in the clinic in 1-2 weeks for postoperative evaluation.  Brayton Mars, MD [facility]

## 2015-05-15 ENCOUNTER — Encounter: Payer: Medicaid Other | Admitting: Obstetrics and Gynecology

## 2015-05-20 ENCOUNTER — Encounter: Payer: Self-pay | Admitting: Obstetrics and Gynecology

## 2015-05-20 ENCOUNTER — Ambulatory Visit (INDEPENDENT_AMBULATORY_CARE_PROVIDER_SITE_OTHER): Payer: Medicaid Other | Admitting: Obstetrics and Gynecology

## 2015-05-20 VITALS — BP 101/68 | HR 120 | Ht 64.5 in | Wt 106.2 lb

## 2015-05-20 DIAGNOSIS — T7491XA Unspecified adult maltreatment, confirmed, initial encounter: Secondary | ICD-10-CM

## 2015-05-20 DIAGNOSIS — Z09 Encounter for follow-up examination after completed treatment for conditions other than malignant neoplasm: Secondary | ICD-10-CM

## 2015-05-20 DIAGNOSIS — O021 Missed abortion: Secondary | ICD-10-CM
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
## 2015-07-01 ENCOUNTER — Ambulatory Visit: Payer: Medicaid Other | Admitting: Obstetrics and Gynecology

## 2016-06-19 ENCOUNTER — Emergency Department: Payer: Self-pay

## 2016-06-19 ENCOUNTER — Emergency Department
Admission: EM | Admit: 2016-06-19 | Discharge: 2016-06-19 | Disposition: A | Discharge status: Home / Self Care | Payer: Self-pay | Attending: Emergency Medicine | Admitting: Emergency Medicine

## 2016-06-19 DIAGNOSIS — Z8543 Personal history of malignant neoplasm of ovary: Secondary | ICD-10-CM | POA: Insufficient documentation

## 2016-06-19 DIAGNOSIS — F909 Attention-deficit hyperactivity disorder, unspecified type: Secondary | ICD-10-CM | POA: Insufficient documentation

## 2016-06-19 DIAGNOSIS — Z791 Long term (current) use of non-steroidal anti-inflammatories (NSAID): Secondary | ICD-10-CM | POA: Insufficient documentation

## 2016-06-19 DIAGNOSIS — N83201 Unspecified ovarian cyst, right side: Secondary | ICD-10-CM | POA: Insufficient documentation

## 2016-06-19 DIAGNOSIS — F17210 Nicotine dependence, cigarettes, uncomplicated: Secondary | ICD-10-CM | POA: Insufficient documentation

## 2016-06-19 LAB — CBC
HEMATOCRIT: 40.3 % (ref 35.0–47.0)
HEMOGLOBIN: 13.8 g/dL (ref 12.0–16.0)
MCH: 29.2 pg (ref 26.0–34.0)
MCHC: 34.1 g/dL (ref 32.0–36.0)
MCV: 85.6 fL (ref 80.0–100.0)
Platelets: 297 10*3/uL (ref 150–440)
RBC: 4.71 MIL/uL (ref 3.80–5.20)
RDW: 13.6 % (ref 11.5–14.5)
WBC: 10.7 10*3/uL (ref 3.6–11.0)

## 2016-06-19 LAB — COMPREHENSIVE METABOLIC PANEL
ALT: 13 U/L — ABNORMAL LOW (ref 14–54)
ANION GAP: 7 (ref 5–15)
AST: 22 U/L (ref 15–41)
Albumin: 4.6 g/dL (ref 3.5–5.0)
Alkaline Phosphatase: 57 U/L (ref 38–126)
BILIRUBIN TOTAL: 0.2 mg/dL — AB (ref 0.3–1.2)
BUN: 8 mg/dL (ref 6–20)
CHLORIDE: 102 mmol/L (ref 101–111)
CO2: 27 mmol/L (ref 22–32)
Calcium: 9.7 mg/dL (ref 8.9–10.3)
Creatinine, Ser: 0.55 mg/dL (ref 0.44–1.00)
Glucose, Bld: 96 mg/dL (ref 65–99)
POTASSIUM: 3.6 mmol/L (ref 3.5–5.1)
Sodium: 136 mmol/L (ref 135–145)
TOTAL PROTEIN: 7.8 g/dL (ref 6.5–8.1)

## 2016-06-19 LAB — URINALYSIS COMPLETE WITH MICROSCOPIC (ARMC ONLY)
Bilirubin Urine: NEGATIVE
Glucose, UA: NEGATIVE mg/dL
HGB URINE DIPSTICK: NEGATIVE
KETONES UR: NEGATIVE mg/dL
LEUKOCYTES UA: NEGATIVE
NITRITE: NEGATIVE
PROTEIN: NEGATIVE mg/dL
SPECIFIC GRAVITY, URINE: 1.011 (ref 1.005–1.030)
WBC UA: NONE SEEN WBC/hpf (ref 0–5)
pH: 8 (ref 5.0–8.0)

## 2016-06-19 LAB — POCT PREGNANCY, URINE: Preg Test, Ur: NEGATIVE

## 2016-06-19 LAB — TROPONIN I: Troponin I: <0.03 ng/mL (ref <0.03)

## 2016-06-19 LAB — LIPASE, BLOOD: LIPASE: 19 U/L (ref 11–51)

## 2016-06-19 MED ORDER — KETOROLAC TROMETHAMINE 60 MG/2ML IM SOLN
30.0000 mg | Freq: Once | INTRAMUSCULAR | Status: AC
  Administered 2016-06-19: 30 mg via INTRAMUSCULAR
  Filled 2016-06-19: qty 2

## 2016-06-19 MED ORDER — DIATRIZOATE MEGLUMINE & SODIUM 66-10 % PO SOLN: 15.0000 mL | Freq: Once | ORAL | Status: DC

## 2016-06-19 MED ORDER — IOPAMIDOL (ISOVUE-300) INJECTION 61%
30.0000 mL | Freq: Once | INTRAVENOUS | Status: AC | PRN
  Administered 2016-06-19: 30 mL via ORAL

## 2016-06-19 MED ORDER — ONDANSETRON 4 MG PO TBDP
4.0000 mg | ORAL_TABLET | Freq: Once | ORAL | Status: AC
  Administered 2016-06-19: 4 mg via ORAL
  Filled 2016-06-19: qty 1

## 2016-06-19 MED ORDER — IOPAMIDOL (ISOVUE-300) INJECTION 61%
100.0000 mL | Freq: Once | INTRAVENOUS | Status: AC | PRN
  Administered 2016-06-19: 100 mL via INTRAVENOUS

## 2016-06-19 MED ORDER — ONDANSETRON HCL 4 MG/2ML IJ SOLN
4.0000 mg | Freq: Once | INTRAMUSCULAR | Status: AC
  Administered 2016-06-19: 4 mg via INTRAVENOUS
  Filled 2016-06-19: qty 2

## 2016-06-19 MED ORDER — ONDANSETRON 4 MG PO TBDP: 4.0000 mg | ORAL_TABLET | Freq: Four times a day (QID) | ORAL | 0 refills | Status: DC | PRN

## 2016-06-19 NOTE — ED Triage Notes (Signed)
Pt presents to ED with multiple medical c/o. Pt reports heart palpitations, frequent urination, nausea, off and on fever, headache, dizziness, and RLQ abdominal pain. Pt reports s/x's started Monday and presents with a paper list of her c/o. Pt is A&Ox4, in NAD, with s/o at bedside.

## 2016-06-19 NOTE — Discharge Instructions (Signed)
Please follow up closely with obstetrics and gynecology or your primary doctor.  Return to the emergency room if your pain becomes severe or worsens, you become weak and dizzy or lightheaded, you have an episode of passing out, develop severe bleeding such as more than 1 soaked pad per hour for more than 3 straight hours, develop abdominal or pelvic pain, fevers chills or other new concerns arise.

## 2016-06-19 NOTE — ED Notes (Signed)
MD at bedside. 
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
## 2016-11-18 IMAGING — US US OB COMP LESS 14 WK
1 series · 13 of 28 positions shown · non-contrast
Comparison: None.

CLINICAL DATA: Miscarriage March 2015.  Bleeding, fever, pain.

EXAM:
OBSTETRIC <14 WK US AND TRANSVAGINAL OB US
TECHNIQUE: Both transabdominal and transvaginal ultrasound examinations were
performed for complete evaluation of the gestation as well as the
maternal uterus, adnexal regions, and pelvic cul-de-sac.
Transvaginal technique was performed to assess early pregnancy.

[Series 1: us ob comp less 14 wk · 0.21mm/px · 13 of 90 slices shown]
[im 4/90]
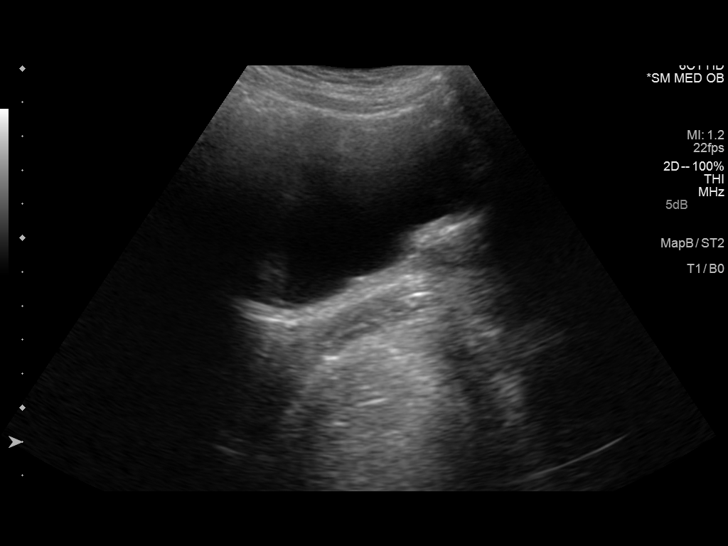
[im 10/90]
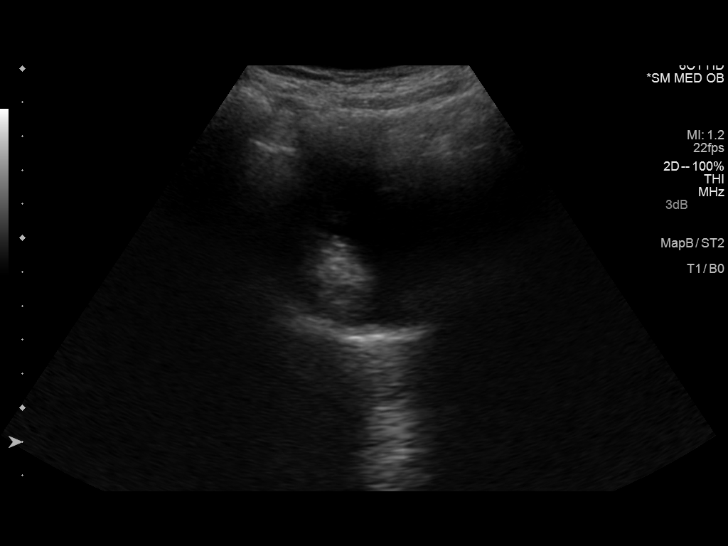
[im 17/90]
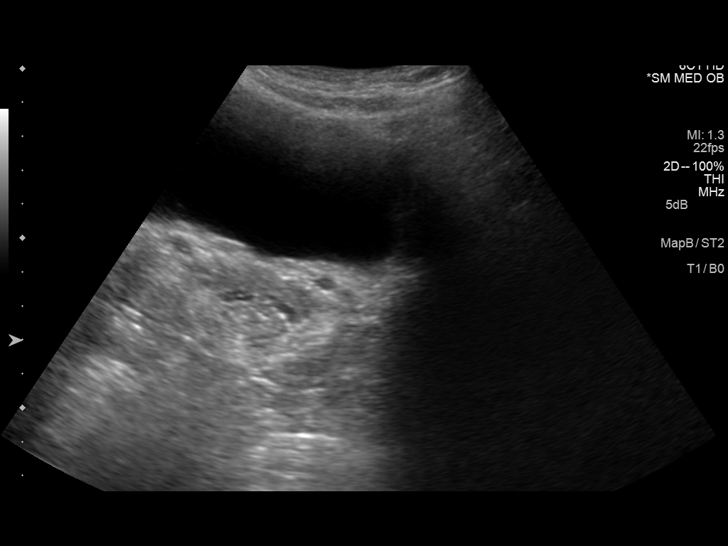
[im 24/90]
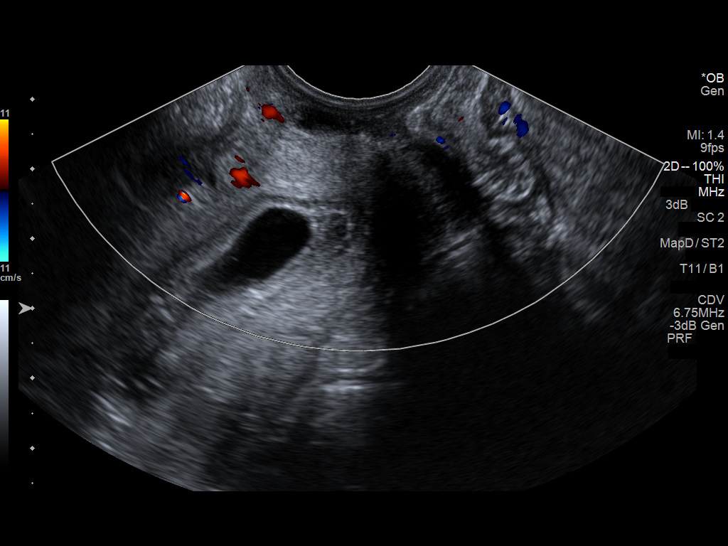
[im 30/90]
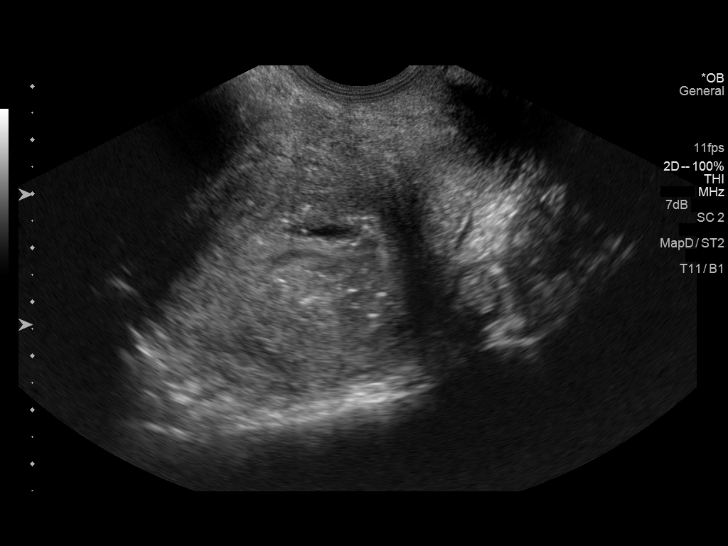
[im 37/90]
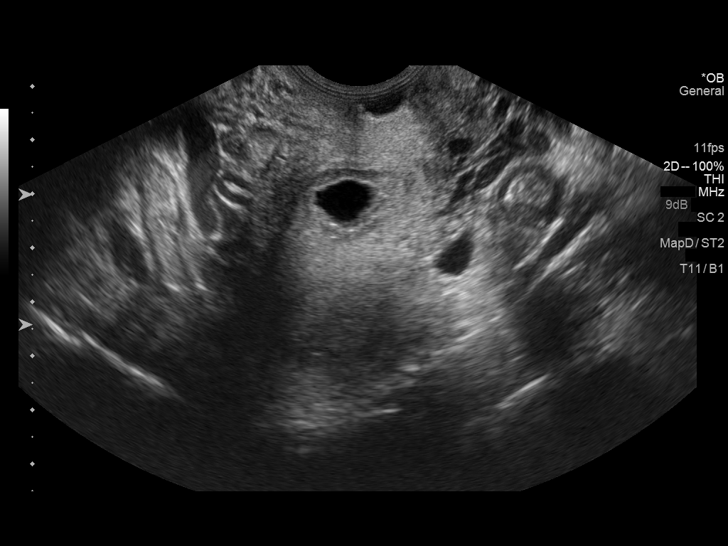
[im 47/90]
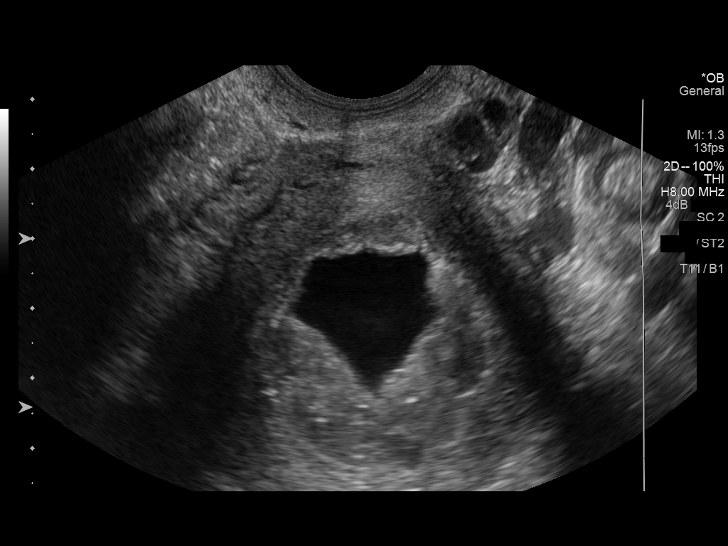
[im 53/90]
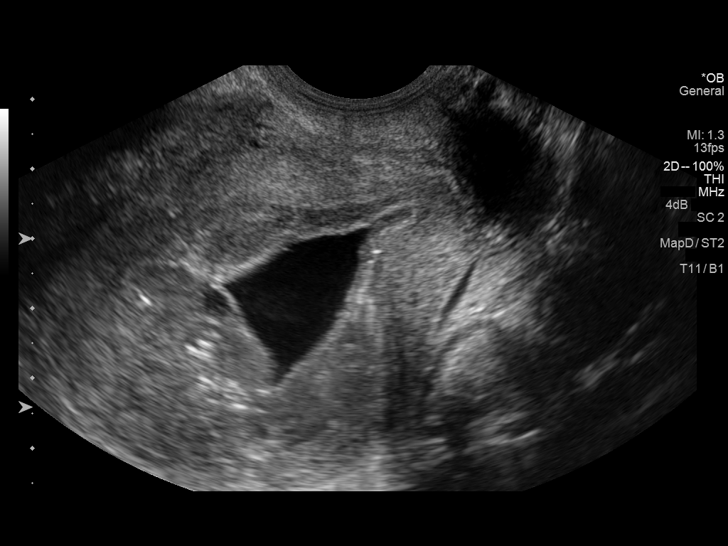
[im 60/90]
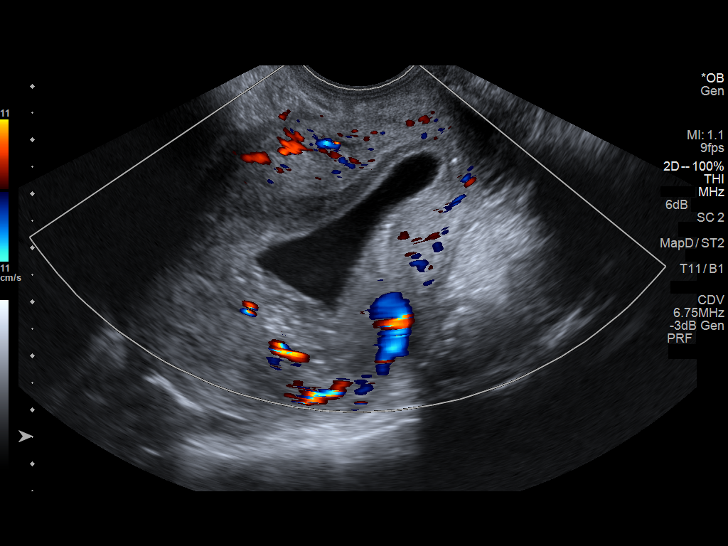
[im 66/90]
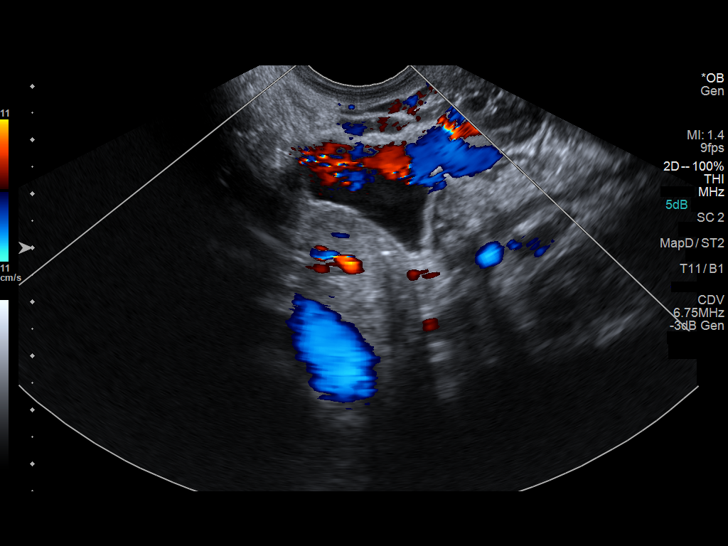
[im 73/90]
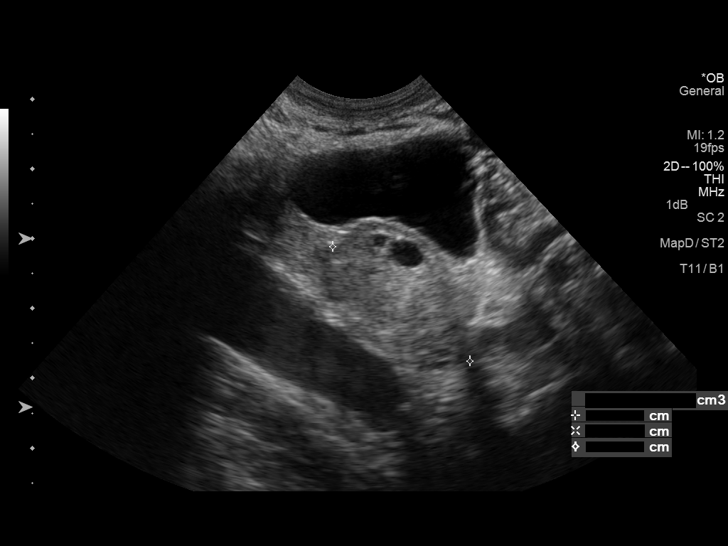
[im 80/90]
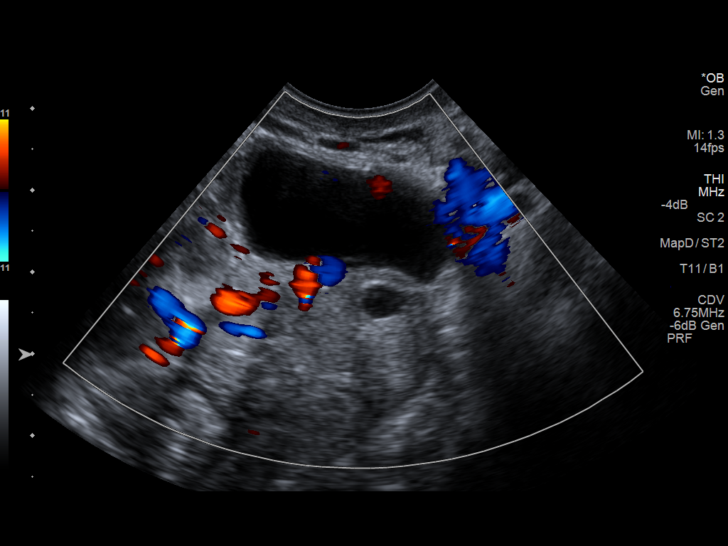
[im 86/90]
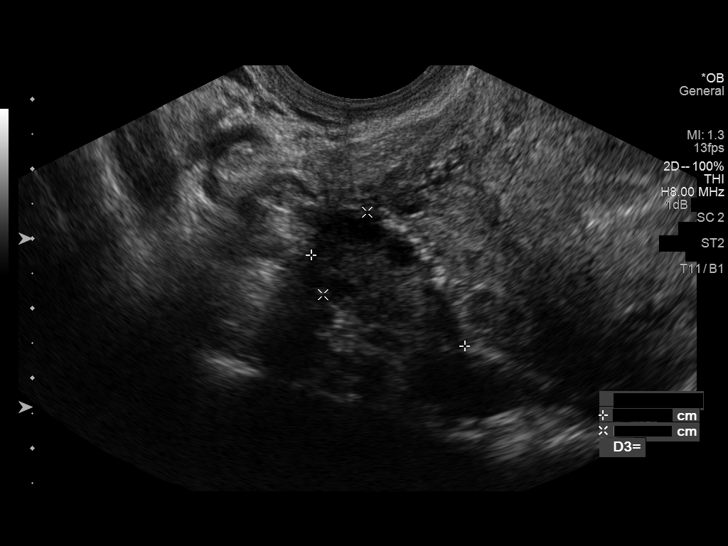

[13 of 28 positions shown; findings below may reference images not displayed]

FINDINGS: Intrauterine gestational sac: Abscess uterine fluid collection
noted.

Yolk sac:  Not visualized

Embryo:  Not visualized

Cardiac Activity: Not visualized

MSD: 2.5 cm 7 w

4  d

US EDC: 12/15/2015

Maternal uterus/adnexae: 2.5 x 1.8 x 2.6 cm left ovarian or
extraovarian cyst.
IMPRESSION: 1. Irregular gestational sac/intrauterine fluid collection. Given
patient's history aborting pregnancy could present this fashion.
Endometritis with abscess cannot be excluded.
2. Left ovarian exophytic or extra ovarian 2.5 x 1.8 x 2.6 cm cyst.
Findings are suspicious but not yet definitive for failed pregnancy.
Recommend follow-up US in 10-14 days for definitive diagnosis. This
recommendation follows SRU consensus guidelines: Diagnostic Criteria
for Nonviable Pregnancy Early in the First Trimester. N Engl J Med

## 2017-09-17 IMAGING — CT CT ABD-PELV W/ CM
2 of 5 series · 16 of 46 positions shown, 18 images · IV contrast (APPLIED)
Comparison: August 31, 2013

CLINICAL DATA: Right lower quadrant pain for 1 week.

EXAM:
CT ABDOMEN AND PELVIS WITH CONTRAST
TECHNIQUE: Multidetector CT imaging of the abdomen and pelvis was performed
using the standard protocol following bolus administration of
intravenous contrast.
CONTRAST:  100mL FASUFP-0BB IOPAMIDOL (FASUFP-0BB) INJECTION 61%

[Series 2: axial st · axial · 0.71mm/px · z∈[-876,-536]mm · 13 of 76 slices shown, 15 images]
[im 4/76  soft-tissue]
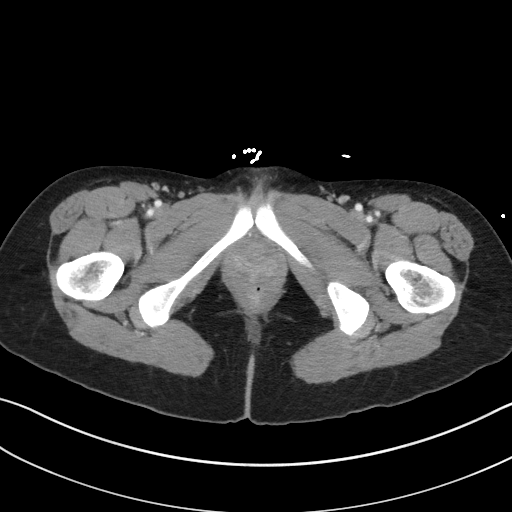
[im 4/76  bone]
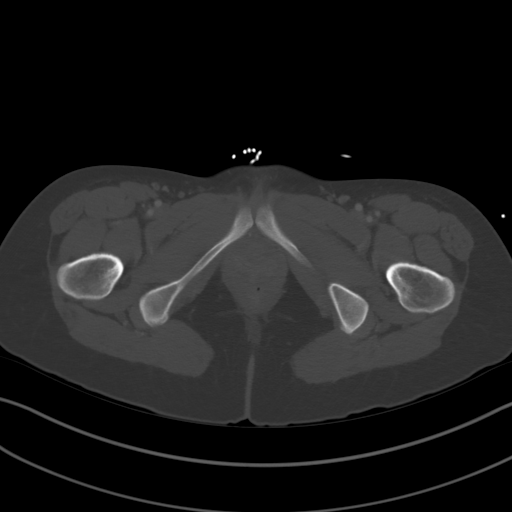
[im 12/76  soft-tissue]
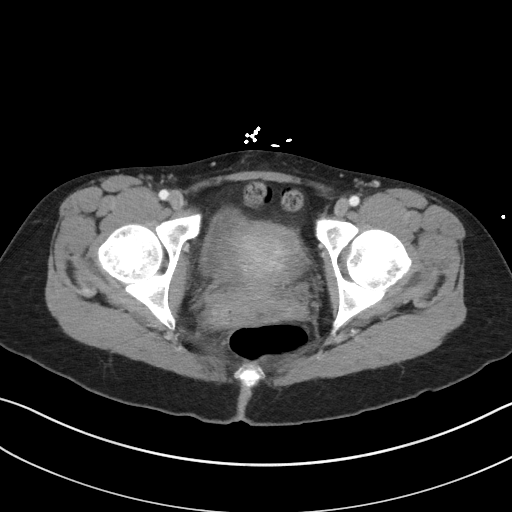
[im 16/76  soft-tissue]
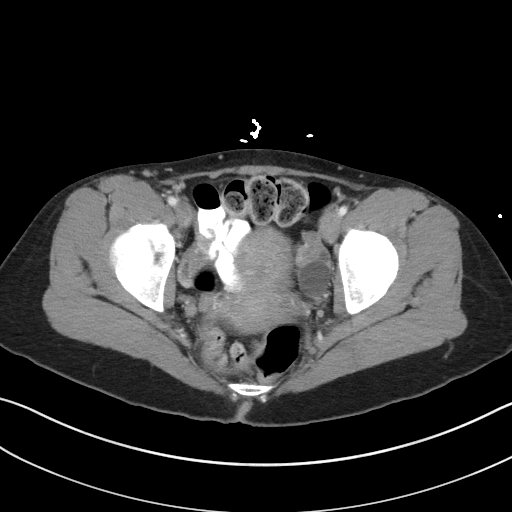
[im 20/76  soft-tissue]
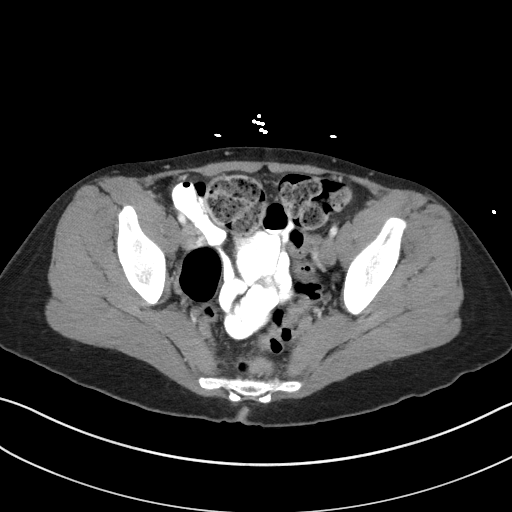
[im 28/76  soft-tissue]
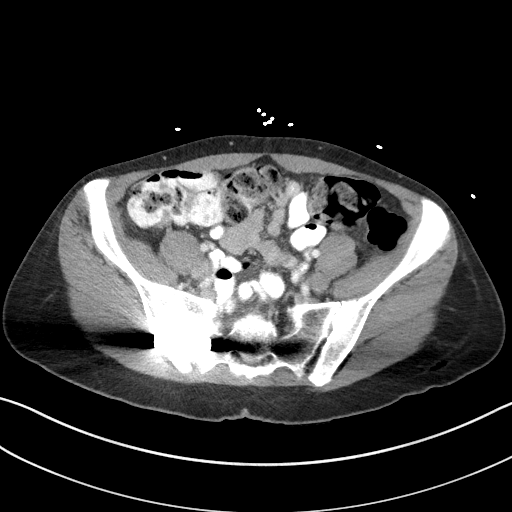
[im 32/76  soft-tissue]
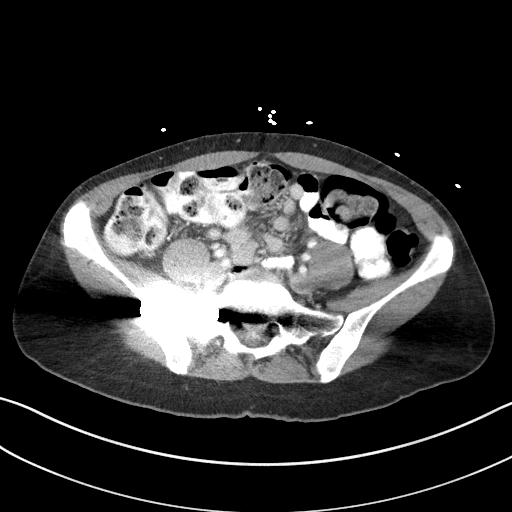
[im 40/76  soft-tissue]
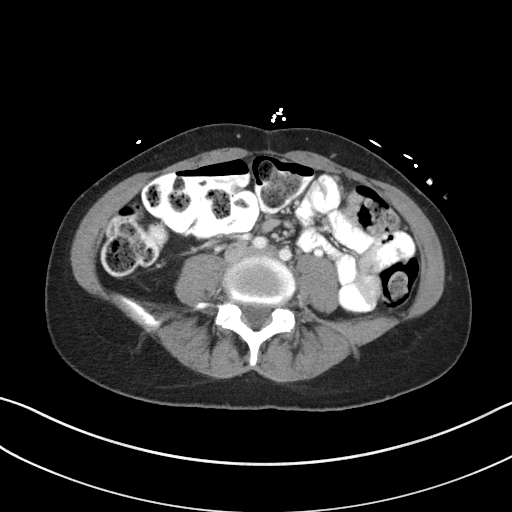
[im 44/76  soft-tissue]
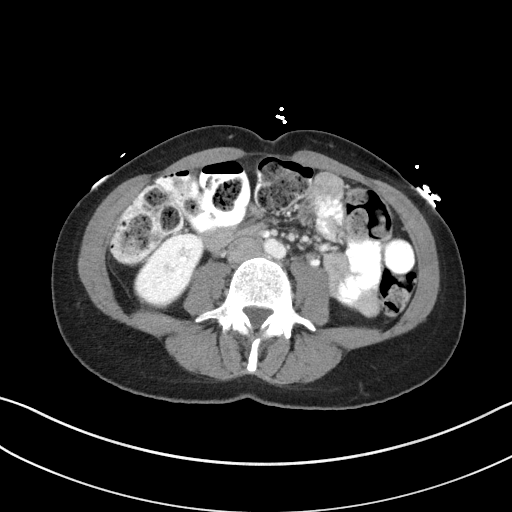
[im 48/76  soft-tissue]
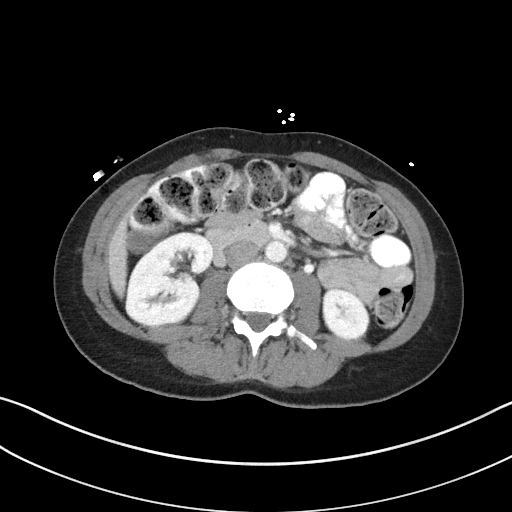
[im 48/76  bone]
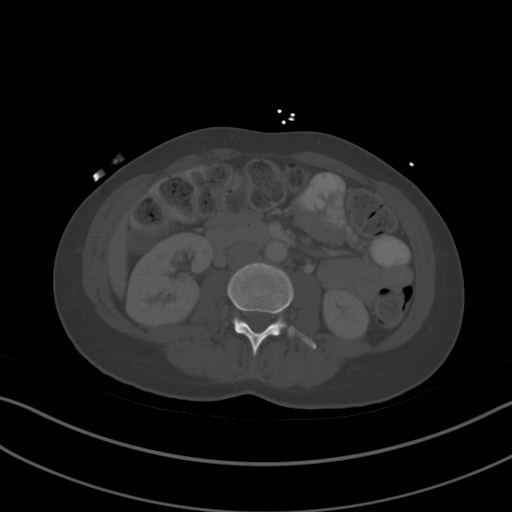
[im 56/76  soft-tissue]
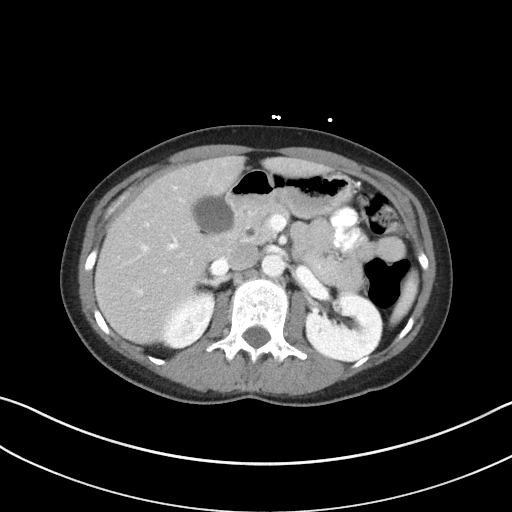
[im 60/76  soft-tissue]
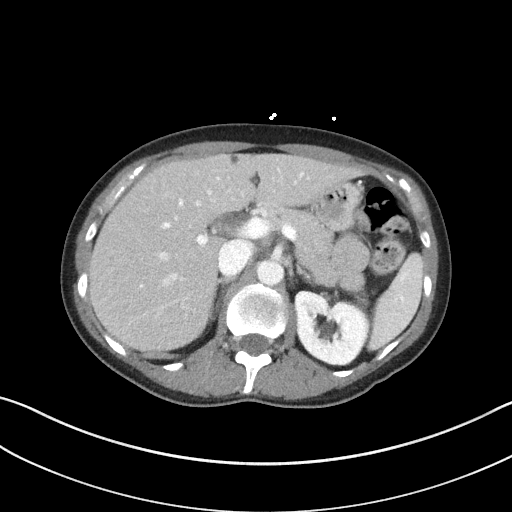
[im 64/76  soft-tissue]
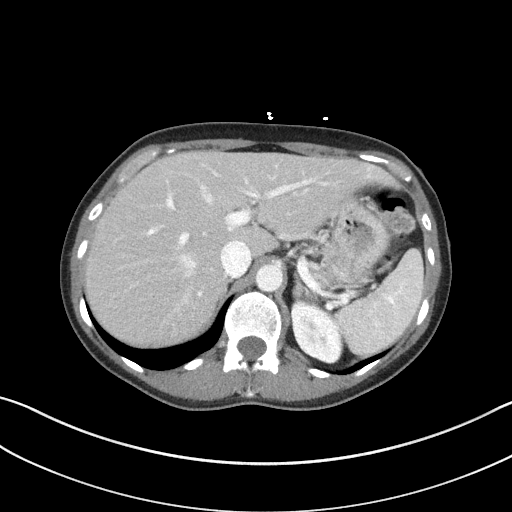
[im 72/76  soft-tissue]
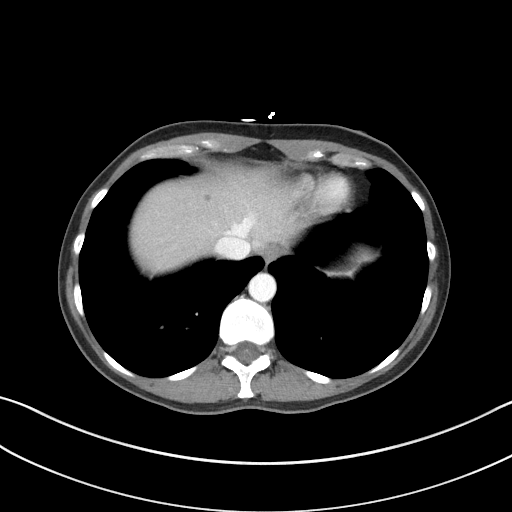

[Series 5: coronal st · coronal · 0.62mm/px · 3 of 70 slices shown]
[im 24/70  soft-tissue]
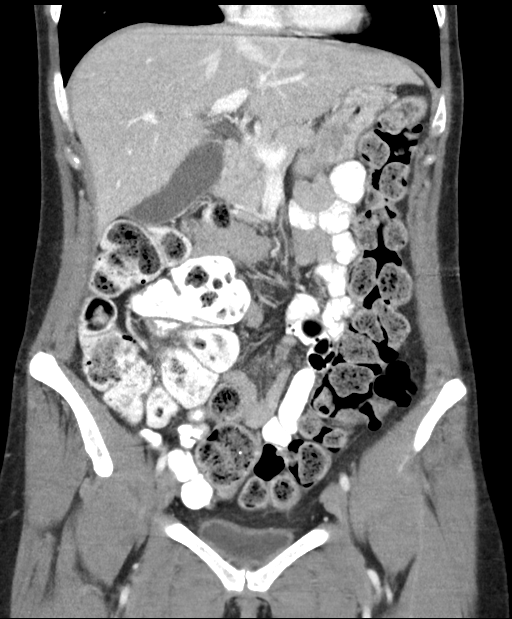
[im 31/70  soft-tissue]
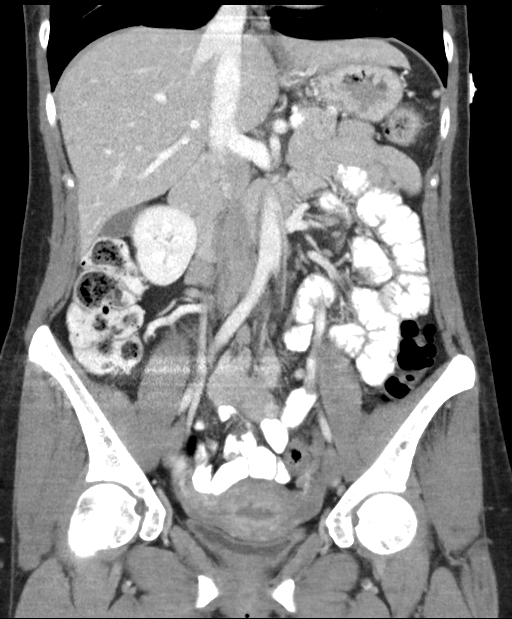
[im 39/70  soft-tissue]
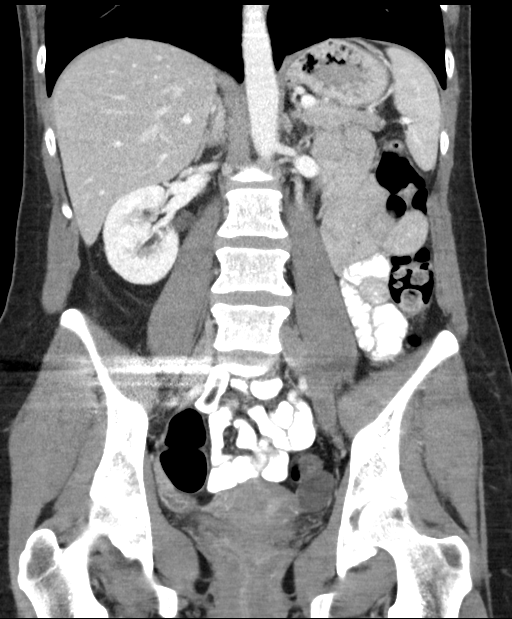

[16 of 46 positions shown; findings below may reference images not displayed]

FINDINGS: Lower chest: No acute abnormality.

Hepatobiliary: Small hepatic cysts are identified. The gallbladder
is mildly distended but otherwise normal, unchanged in the interval.
The portal vein is normal in appearance.

Pancreas: Unremarkable. No pancreatic ductal dilatation or
surrounding inflammatory changes.

Spleen: Normal in size without focal abnormality.

Adrenals/Urinary Tract: Adrenal glands are unremarkable. Kidneys are
normal, without renal calculi, focal lesion, or hydronephrosis.
Bladder is unremarkable.

Stomach/Bowel: The stomach and small bowel are normal. The colon and
visualized portions of the appendix are unremarkable. No evidence of
appendicitis.

Vascular/Lymphatic: No significant vascular findings are present. No
enlarged abdominal or pelvic lymph nodes.

Reproductive: Uterus is unremarkable. A dominant follicle is seen in
the left ovary measuring 2.4 cm. Ring enhancement of the right
ovaries consistent with corpus luteum cyst.

Other: No abdominal wall hernia or abnormality. No abdominopelvic
ascites.

Musculoskeletal: No acute or significant osseous findings.
IMPRESSION: 1. Corpus luteum cyst in the right ovary. This could be responsible
for the patient's symptoms. The appendix is normal within visualize
limits.

## 2021-01-03 ENCOUNTER — Emergency Department (HOSPITAL_COMMUNITY): Payer: Medicaid Other | Admitting: Anesthesiology

## 2021-01-03 ENCOUNTER — Emergency Department (HOSPITAL_COMMUNITY): Payer: Medicaid Other

## 2021-01-03 ENCOUNTER — Encounter (HOSPITAL_COMMUNITY)
Admission: EM | Disposition: A | Discharge status: Home / Self Care | Payer: Self-pay | Source: Home / Self Care | Attending: Orthopedic Surgery

## 2021-01-03 ENCOUNTER — Encounter (HOSPITAL_COMMUNITY): Payer: Self-pay

## 2021-01-03 ENCOUNTER — Inpatient Hospital Stay (HOSPITAL_COMMUNITY): Payer: Medicaid Other

## 2021-01-03 ENCOUNTER — Inpatient Hospital Stay (HOSPITAL_COMMUNITY)
Admission: EM | Admit: 2021-01-03 | Discharge: 2021-01-08 | DRG: 493 | Disposition: A | Discharge status: Home / Self Care | Payer: Medicaid Other | Attending: Orthopedic Surgery | Admitting: Orthopedic Surgery

## 2021-01-03 DIAGNOSIS — S82401B Unspecified fracture of shaft of right fibula, initial encounter for open fracture type I or II: Secondary | ICD-10-CM

## 2021-01-03 DIAGNOSIS — S82871B Displaced pilon fracture of right tibia, initial encounter for open fracture type I or II: Secondary | ICD-10-CM | POA: Diagnosis present

## 2021-01-03 DIAGNOSIS — F17210 Nicotine dependence, cigarettes, uncomplicated: Secondary | ICD-10-CM | POA: Diagnosis present

## 2021-01-03 DIAGNOSIS — D62 Acute posthemorrhagic anemia: Secondary | ICD-10-CM | POA: Diagnosis not present

## 2021-01-03 DIAGNOSIS — F419 Anxiety disorder, unspecified: Secondary | ICD-10-CM | POA: Diagnosis present

## 2021-01-03 DIAGNOSIS — E559 Vitamin D deficiency, unspecified: Secondary | ICD-10-CM | POA: Diagnosis present

## 2021-01-03 DIAGNOSIS — Z833 Family history of diabetes mellitus: Secondary | ICD-10-CM | POA: Diagnosis not present

## 2021-01-03 DIAGNOSIS — Z79891 Long term (current) use of opiate analgesic: Secondary | ICD-10-CM

## 2021-01-03 DIAGNOSIS — Z888 Allergy status to other drugs, medicaments and biological substances status: Secondary | ICD-10-CM

## 2021-01-03 DIAGNOSIS — S161XXA Strain of muscle, fascia and tendon at neck level, initial encounter: Secondary | ICD-10-CM

## 2021-01-03 DIAGNOSIS — S82831B Other fracture of upper and lower end of right fibula, initial encounter for open fracture type I or II: Secondary | ICD-10-CM | POA: Diagnosis present

## 2021-01-03 DIAGNOSIS — S82301B Unspecified fracture of lower end of right tibia, initial encounter for open fracture type I or II: Secondary | ICD-10-CM

## 2021-01-03 DIAGNOSIS — S20219A Contusion of unspecified front wall of thorax, initial encounter: Secondary | ICD-10-CM

## 2021-01-03 DIAGNOSIS — G8929 Other chronic pain: Secondary | ICD-10-CM | POA: Diagnosis present

## 2021-01-03 DIAGNOSIS — M898X9 Other specified disorders of bone, unspecified site: Secondary | ICD-10-CM | POA: Diagnosis present

## 2021-01-03 DIAGNOSIS — Z885 Allergy status to narcotic agent status: Secondary | ICD-10-CM

## 2021-01-03 DIAGNOSIS — Z419 Encounter for procedure for purposes other than remedying health state, unspecified: Secondary | ICD-10-CM

## 2021-01-03 DIAGNOSIS — M549 Dorsalgia, unspecified: Secondary | ICD-10-CM | POA: Diagnosis present

## 2021-01-03 DIAGNOSIS — Z20822 Contact with and (suspected) exposure to covid-19: Secondary | ICD-10-CM | POA: Diagnosis present

## 2021-01-03 DIAGNOSIS — Y92410 Unspecified street and highway as the place of occurrence of the external cause: Secondary | ICD-10-CM

## 2021-01-03 DIAGNOSIS — T148XXA Other injury of unspecified body region, initial encounter: Secondary | ICD-10-CM

## 2021-01-03 DIAGNOSIS — S82201B Unspecified fracture of shaft of right tibia, initial encounter for open fracture type I or II: Secondary | ICD-10-CM

## 2021-01-03 DIAGNOSIS — F1190 Opioid use, unspecified, uncomplicated: Secondary | ICD-10-CM

## 2021-01-03 DIAGNOSIS — Z8543 Personal history of malignant neoplasm of ovary: Secondary | ICD-10-CM | POA: Diagnosis not present

## 2021-01-03 DIAGNOSIS — F17200 Nicotine dependence, unspecified, uncomplicated: Secondary | ICD-10-CM | POA: Diagnosis present

## 2021-01-03 DIAGNOSIS — Z720 Tobacco use: Secondary | ICD-10-CM | POA: Diagnosis present

## 2021-01-03 DIAGNOSIS — Z09 Encounter for follow-up examination after completed treatment for conditions other than malignant neoplasm: Secondary | ICD-10-CM

## 2021-01-03 DIAGNOSIS — S301XXA Contusion of abdominal wall, initial encounter: Secondary | ICD-10-CM

## 2021-01-03 DIAGNOSIS — Z23 Encounter for immunization: Secondary | ICD-10-CM

## 2021-01-03 HISTORY — PX: I & D EXTREMITY: SHX5045

## 2021-01-03 HISTORY — PX: EXTERNAL FIXATION LEG: SHX1549

## 2021-01-03 HISTORY — PX: INTRAMEDULLARY (IM) NAIL FIBULA: SHX5874

## 2021-01-03 LAB — TYPE AND SCREEN
ABO/RH(D): A POS
Antibody Screen: NEGATIVE

## 2021-01-03 LAB — CBC WITH DIFFERENTIAL/PLATELET
Abs Immature Granulocytes: 0.05 10*3/uL (ref 0.00–0.07)
Basophils Absolute: 0 10*3/uL (ref 0.0–0.1)
Basophils Relative: 0 %
Eosinophils Absolute: 0.1 10*3/uL (ref 0.0–0.5)
Eosinophils Relative: 1 %
HCT: 35.7 % — ABNORMAL LOW (ref 36.0–46.0)
Hemoglobin: 11.3 g/dL — ABNORMAL LOW (ref 12.0–15.0)
Immature Granulocytes: 1 %
Lymphocytes Relative: 23 %
Lymphs Abs: 2.4 10*3/uL (ref 0.7–4.0)
MCH: 26.6 pg (ref 26.0–34.0)
MCHC: 31.7 g/dL (ref 30.0–36.0)
MCV: 84 fL (ref 80.0–100.0)
Monocytes Absolute: 0.8 10*3/uL (ref 0.1–1.0)
Monocytes Relative: 8 %
Neutro Abs: 7.1 10*3/uL (ref 1.7–7.7)
Neutrophils Relative %: 67 %
Platelets: 343 10*3/uL (ref 150–400)
RBC: 4.25 MIL/uL (ref 3.87–5.11)
RDW: 15.8 % — ABNORMAL HIGH (ref 11.5–15.5)
WBC: 10.4 10*3/uL (ref 4.0–10.5)
nRBC: 0 % (ref 0.0–0.2)

## 2021-01-03 LAB — PROTIME-INR
INR: 1.1 (ref 0.8–1.2)
Prothrombin Time: 13.9 seconds (ref 11.4–15.2)

## 2021-01-03 LAB — BASIC METABOLIC PANEL
Anion gap: 12 (ref 5–15)
BUN: 7 mg/dL (ref 6–20)
CO2: 19 mmol/L — ABNORMAL LOW (ref 22–32)
Calcium: 8.8 mg/dL — ABNORMAL LOW (ref 8.9–10.3)
Chloride: 105 mmol/L (ref 98–111)
Creatinine, Ser: 0.54 mg/dL (ref 0.44–1.00)
GFR, Estimated: >60 mL/min (ref >60)
Glucose, Bld: 108 mg/dL — ABNORMAL HIGH (ref 70–99)
Potassium: 3.4 mmol/L — ABNORMAL LOW (ref 3.5–5.1)
Sodium: 136 mmol/L (ref 135–145)

## 2021-01-03 LAB — CBC
HCT: 31.9 % — ABNORMAL LOW (ref 36.0–46.0)
Hemoglobin: 10.2 g/dL — ABNORMAL LOW (ref 12.0–15.0)
MCH: 26.6 pg (ref 26.0–34.0)
MCHC: 32 g/dL (ref 30.0–36.0)
MCV: 83.1 fL (ref 80.0–100.0)
Platelets: 279 10*3/uL (ref 150–400)
RBC: 3.84 MIL/uL — ABNORMAL LOW (ref 3.87–5.11)
RDW: 15.6 % — ABNORMAL HIGH (ref 11.5–15.5)
WBC: 11.4 10*3/uL — ABNORMAL HIGH (ref 4.0–10.5)
nRBC: 0 % (ref 0.0–0.2)

## 2021-01-03 LAB — CREATININE, SERUM
Creatinine, Ser: 0.6 mg/dL (ref 0.44–1.00)
GFR, Estimated: >60 mL/min (ref >60)

## 2021-01-03 LAB — I-STAT BETA HCG BLOOD, ED (MC, WL, AP ONLY): I-stat hCG, quantitative: <5 m[IU]/mL (ref <5)

## 2021-01-03 LAB — RESP PANEL BY RT-PCR (FLU A&B, COVID) ARPGX2
Influenza A by PCR: NEGATIVE
Influenza B by PCR: NEGATIVE
SARS Coronavirus 2 by RT PCR: NEGATIVE

## 2021-01-03 LAB — ETHANOL: Alcohol, Ethyl (B): <10 mg/dL (ref <10)

## 2021-01-03 LAB — HCG, SERUM, QUALITATIVE: Preg, Serum: NEGATIVE

## 2021-01-03 SURGERY — EXTERNAL FIXATION, LOWER EXTREMITY
Anesthesia: General | Laterality: Right

## 2021-01-03 MED ORDER — DEXAMETHASONE SODIUM PHOSPHATE 10 MG/ML IJ SOLN
INTRAMUSCULAR | Status: AC
  Filled 2021-01-03: qty 1

## 2021-01-03 MED ORDER — PREGABALIN 75 MG PO CAPS
75.0000 mg | ORAL_CAPSULE | Freq: Three times a day (TID) | ORAL | Status: DC
  Administered 2021-01-03 – 2021-01-08 (×13): 75 mg via ORAL
  Filled 2021-01-03 (×13): qty 1

## 2021-01-03 MED ORDER — HYDROMORPHONE HCL 2 MG PO TABS
4.0000 mg | ORAL_TABLET | ORAL | Status: DC | PRN
  Administered 2021-01-03 – 2021-01-04 (×4): 6 mg via ORAL
  Administered 2021-01-04: 4 mg via ORAL
  Administered 2021-01-05 (×2): 6 mg via ORAL
  Filled 2021-01-03 (×5): qty 3
  Filled 2021-01-03: qty 2
  Filled 2021-01-03: qty 3

## 2021-01-03 MED ORDER — LIDOCAINE 2% (20 MG/ML) 5 ML SYRINGE
INTRAMUSCULAR | Status: DC | PRN
  Administered 2021-01-03: 60 mg via INTRAVENOUS

## 2021-01-03 MED ORDER — SODIUM CHLORIDE 0.9 % IV SOLN
1.0000 g | Freq: Once | INTRAVENOUS | Status: AC
  Administered 2021-01-03: 1 g via INTRAVENOUS
  Filled 2021-01-03: qty 10

## 2021-01-03 MED ORDER — HYDROMORPHONE HCL 1 MG/ML IJ SOLN
1.0000 mg | Freq: Once | INTRAMUSCULAR | Status: AC
  Administered 2021-01-03: 1 mg via INTRAVENOUS
  Filled 2021-01-03: qty 1

## 2021-01-03 MED ORDER — CEFAZOLIN SODIUM-DEXTROSE 2-4 GM/100ML-% IV SOLN
2.0000 g | Freq: Three times a day (TID) | INTRAVENOUS | Status: AC
Start: 2021-01-03 — End: 2021-01-05
  Administered 2021-01-03 – 2021-01-05 (×6): 2 g via INTRAVENOUS
  Filled 2021-01-03 (×6): qty 100

## 2021-01-03 MED ORDER — HYDROMORPHONE HCL 1 MG/ML IJ SOLN
0.5000 mg | INTRAMUSCULAR | Status: DC | PRN
  Administered 2021-01-03 – 2021-01-04 (×4): 1 mg via INTRAVENOUS
  Administered 2021-01-04: 0.5 mg via INTRAVENOUS
  Administered 2021-01-05: 1 mg via INTRAVENOUS
  Administered 2021-01-05: 0.5 mg via INTRAVENOUS
  Administered 2021-01-05: 1 mg via INTRAVENOUS
  Filled 2021-01-03 (×8): qty 1

## 2021-01-03 MED ORDER — ACETAMINOPHEN 500 MG PO TABS
1000.0000 mg | ORAL_TABLET | Freq: Four times a day (QID) | ORAL | Status: DC
  Administered 2021-01-03 – 2021-01-08 (×18): 1000 mg via ORAL
  Filled 2021-01-03 (×18): qty 2

## 2021-01-03 MED ORDER — ORAL CARE MOUTH RINSE: 15.0000 mL | Freq: Once | OROMUCOSAL | Status: AC

## 2021-01-03 MED ORDER — ARTIFICIAL TEARS OPHTHALMIC OINT
TOPICAL_OINTMENT | OPHTHALMIC | Status: AC
  Filled 2021-01-03: qty 3.5

## 2021-01-03 MED ORDER — ACETAMINOPHEN 10 MG/ML IV SOLN
1000.0000 mg | Freq: Once | INTRAVENOUS | Status: DC | PRN
  Administered 2021-01-03: 1000 mg via INTRAVENOUS

## 2021-01-03 MED ORDER — FENTANYL CITRATE (PF) 100 MCG/2ML IJ SOLN
INTRAMUSCULAR | Status: DC | PRN
  Administered 2021-01-03: 50 ug via INTRAVENOUS
  Administered 2021-01-03: 250 ug via INTRAVENOUS

## 2021-01-03 MED ORDER — AMISULPRIDE (ANTIEMETIC) 5 MG/2ML IV SOLN: 10.0000 mg | Freq: Once | INTRAVENOUS | Status: DC | PRN

## 2021-01-03 MED ORDER — KETOROLAC TROMETHAMINE 30 MG/ML IJ SOLN
INTRAMUSCULAR | Status: AC
  Filled 2021-01-03: qty 1

## 2021-01-03 MED ORDER — SODIUM CHLORIDE 0.9 % IV SOLN: 1.0000 g | Freq: Once | INTRAVENOUS | Status: DC

## 2021-01-03 MED ORDER — ACETAMINOPHEN 10 MG/ML IV SOLN
INTRAVENOUS | Status: AC
  Filled 2021-01-03: qty 100

## 2021-01-03 MED ORDER — HYDROMORPHONE HCL 1 MG/ML IJ SOLN
INTRAMUSCULAR | Status: AC
  Filled 2021-01-03: qty 1

## 2021-01-03 MED ORDER — ACETAMINOPHEN 10 MG/ML IV SOLN
1000.0000 mg | Freq: Four times a day (QID) | INTRAVENOUS | Status: DC
  Administered 2021-01-03: 1000 mg via INTRAVENOUS
  Filled 2021-01-03 (×2): qty 100

## 2021-01-03 MED ORDER — DIPHENHYDRAMINE HCL 50 MG/ML IJ SOLN
INTRAMUSCULAR | Status: DC | PRN
  Administered 2021-01-03: 12.5 mg via INTRAVENOUS

## 2021-01-03 MED ORDER — SUGAMMADEX SODIUM 200 MG/2ML IV SOLN
INTRAVENOUS | Status: DC | PRN
  Administered 2021-01-03: 200 mg via INTRAVENOUS

## 2021-01-03 MED ORDER — CEFAZOLIN SODIUM-DEXTROSE 2-4 GM/100ML-% IV SOLN
INTRAVENOUS | Status: AC
  Filled 2021-01-03: qty 100

## 2021-01-03 MED ORDER — ROCURONIUM BROMIDE 10 MG/ML (PF) SYRINGE
PREFILLED_SYRINGE | INTRAVENOUS | Status: AC
  Filled 2021-01-03: qty 10

## 2021-01-03 MED ORDER — DEXAMETHASONE SODIUM PHOSPHATE 10 MG/ML IJ SOLN
INTRAMUSCULAR | Status: DC | PRN
  Administered 2021-01-03: 10 mg via INTRAVENOUS

## 2021-01-03 MED ORDER — CHLORHEXIDINE GLUCONATE 0.12 % MT SOLN
OROMUCOSAL | Status: AC
  Administered 2021-01-03: 15 mL via OROMUCOSAL
  Filled 2021-01-03: qty 15

## 2021-01-03 MED ORDER — ONDANSETRON HCL 4 MG/2ML IJ SOLN
INTRAMUSCULAR | Status: AC
  Filled 2021-01-03: qty 2

## 2021-01-03 MED ORDER — IOHEXOL 300 MG/ML  SOLN
100.0000 mL | Freq: Once | INTRAMUSCULAR | Status: AC | PRN
  Administered 2021-01-03: 100 mL via INTRAVENOUS

## 2021-01-03 MED ORDER — FENTANYL CITRATE (PF) 100 MCG/2ML IJ SOLN
25.0000 ug | INTRAMUSCULAR | Status: DC | PRN
  Administered 2021-01-03 (×2): 50 ug via INTRAVENOUS

## 2021-01-03 MED ORDER — KETOROLAC TROMETHAMINE 15 MG/ML IJ SOLN
15.0000 mg | Freq: Four times a day (QID) | INTRAMUSCULAR | Status: AC
  Administered 2021-01-03 – 2021-01-04 (×3): 15 mg via INTRAVENOUS
  Filled 2021-01-03 (×3): qty 1

## 2021-01-03 MED ORDER — TETANUS-DIPHTH-ACELL PERTUSSIS 5-2.5-18.5 LF-MCG/0.5 IM SUSY
0.5000 mL | PREFILLED_SYRINGE | Freq: Once | INTRAMUSCULAR | Status: AC
  Administered 2021-01-03: 0.5 mL via INTRAMUSCULAR
  Filled 2021-01-03: qty 0.5

## 2021-01-03 MED ORDER — MIDAZOLAM HCL 5 MG/5ML IJ SOLN
INTRAMUSCULAR | Status: DC | PRN
  Administered 2021-01-03: 2 mg via INTRAVENOUS

## 2021-01-03 MED ORDER — METHOCARBAMOL 1000 MG/10ML IJ SOLN: 500.0000 mg | Freq: Three times a day (TID) | INTRAMUSCULAR | Status: DC

## 2021-01-03 MED ORDER — METOCLOPRAMIDE HCL 5 MG PO TABS: 5.0000 mg | ORAL_TABLET | Freq: Three times a day (TID) | ORAL | Status: DC | PRN

## 2021-01-03 MED ORDER — ALPRAZOLAM 0.5 MG PO TABS
0.5000 mg | ORAL_TABLET | Freq: Two times a day (BID) | ORAL | Status: DC | PRN
  Administered 2021-01-03: 0.5 mg via ORAL
  Filled 2021-01-03 (×2): qty 1

## 2021-01-03 MED ORDER — SODIUM CHLORIDE 0.9 % IV SOLN
1.0000 g | INTRAVENOUS | Status: AC
  Administered 2021-01-03: 2 g via INTRAVENOUS
  Filled 2021-01-03: qty 10

## 2021-01-03 MED ORDER — ACETAMINOPHEN 325 MG PO TABS
325.0000 mg | ORAL_TABLET | Freq: Four times a day (QID) | ORAL | Status: DC | PRN
  Filled 2021-01-03: qty 2

## 2021-01-03 MED ORDER — MIDAZOLAM HCL 2 MG/2ML IJ SOLN
INTRAMUSCULAR | Status: AC
  Filled 2021-01-03: qty 2

## 2021-01-03 MED ORDER — ONDANSETRON HCL 4 MG/2ML IJ SOLN: 4.0000 mg | Freq: Four times a day (QID) | INTRAMUSCULAR | Status: DC | PRN

## 2021-01-03 MED ORDER — CEFAZOLIN SODIUM 1 G IJ SOLR
INTRAMUSCULAR | Status: AC
  Filled 2021-01-03: qty 10

## 2021-01-03 MED ORDER — 0.9 % SODIUM CHLORIDE (POUR BTL) OPTIME
TOPICAL | Status: DC | PRN
  Administered 2021-01-03: 1000 mL

## 2021-01-03 MED ORDER — ROCURONIUM BROMIDE 10 MG/ML (PF) SYRINGE
PREFILLED_SYRINGE | INTRAVENOUS | Status: DC | PRN
  Administered 2021-01-03: 20 mg via INTRAVENOUS
  Administered 2021-01-03: 30 mg via INTRAVENOUS
  Administered 2021-01-03: 20 mg via INTRAVENOUS

## 2021-01-03 MED ORDER — ONDANSETRON HCL 4 MG/2ML IJ SOLN
INTRAMUSCULAR | Status: DC | PRN
  Administered 2021-01-03: 4 mg via INTRAVENOUS

## 2021-01-03 MED ORDER — FENTANYL CITRATE (PF) 250 MCG/5ML IJ SOLN
INTRAMUSCULAR | Status: AC
  Filled 2021-01-03: qty 5

## 2021-01-03 MED ORDER — PROPOFOL 10 MG/ML IV BOLUS
INTRAVENOUS | Status: AC
  Filled 2021-01-03: qty 20

## 2021-01-03 MED ORDER — LIDOCAINE 2% (20 MG/ML) 5 ML SYRINGE
INTRAMUSCULAR | Status: AC
  Filled 2021-01-03: qty 10

## 2021-01-03 MED ORDER — FENTANYL CITRATE (PF) 100 MCG/2ML IJ SOLN
INTRAMUSCULAR | Status: AC
  Filled 2021-01-03: qty 2

## 2021-01-03 MED ORDER — KETOROLAC TROMETHAMINE 30 MG/ML IJ SOLN
30.0000 mg | Freq: Once | INTRAMUSCULAR | Status: AC
  Administered 2021-01-03: 30 mg via INTRAVENOUS

## 2021-01-03 MED ORDER — LACTATED RINGERS IV SOLN: INTRAVENOUS | Status: DC

## 2021-01-03 MED ORDER — PANTOPRAZOLE SODIUM 40 MG PO TBEC
40.0000 mg | DELAYED_RELEASE_TABLET | Freq: Every day | ORAL | Status: DC
  Administered 2021-01-03 – 2021-01-08 (×4): 40 mg via ORAL
  Filled 2021-01-03 (×6): qty 1

## 2021-01-03 MED ORDER — POTASSIUM CHLORIDE IN NACL 20-0.9 MEQ/L-% IV SOLN
INTRAVENOUS | Status: DC
  Filled 2021-01-03 (×7): qty 1000

## 2021-01-03 MED ORDER — SODIUM CHLORIDE 0.9 % IR SOLN
Status: DC | PRN
  Administered 2021-01-03 (×2): 3000 mL

## 2021-01-03 MED ORDER — PROPOFOL 10 MG/ML IV BOLUS
INTRAVENOUS | Status: DC | PRN
  Administered 2021-01-03: 200 mg via INTRAVENOUS

## 2021-01-03 MED ORDER — EPHEDRINE 5 MG/ML INJ
INTRAVENOUS | Status: AC
  Filled 2021-01-03: qty 20

## 2021-01-03 MED ORDER — METHOCARBAMOL 1000 MG/10ML IJ SOLN
500.0000 mg | Freq: Once | INTRAVENOUS | Status: AC
  Administered 2021-01-03: 500 mg via INTRAVENOUS
  Filled 2021-01-03: qty 5

## 2021-01-03 MED ORDER — METHOCARBAMOL 500 MG PO TABS
750.0000 mg | ORAL_TABLET | Freq: Three times a day (TID) | ORAL | Status: DC
  Administered 2021-01-03 – 2021-01-08 (×13): 750 mg via ORAL
  Filled 2021-01-03 (×6): qty 1
  Filled 2021-01-03 (×2): qty 2
  Filled 2021-01-03 (×2): qty 1
  Filled 2021-01-03: qty 2
  Filled 2021-01-03 (×3): qty 1
  Filled 2021-01-03: qty 2
  Filled 2021-01-03: qty 1
  Filled 2021-01-03: qty 2

## 2021-01-03 MED ORDER — KETOROLAC TROMETHAMINE 30 MG/ML IJ SOLN
30.0000 mg | Freq: Once | INTRAMUSCULAR | Status: AC
  Administered 2021-01-03: 30 mg via INTRAVENOUS
  Filled 2021-01-03: qty 1

## 2021-01-03 MED ORDER — METOCLOPRAMIDE HCL 5 MG/ML IJ SOLN: 5.0000 mg | Freq: Three times a day (TID) | INTRAMUSCULAR | Status: DC | PRN

## 2021-01-03 MED ORDER — SODIUM CHLORIDE 0.9 % IV BOLUS
1000.0000 mL | Freq: Once | INTRAVENOUS | Status: AC
  Administered 2021-01-03: 1000 mL via INTRAVENOUS

## 2021-01-03 MED ORDER — HYDROMORPHONE HCL 1 MG/ML IJ SOLN
0.2500 mg | INTRAMUSCULAR | Status: DC | PRN
Start: 2021-01-03 — End: 2021-01-03
  Administered 2021-01-03 (×2): 0.5 mg via INTRAVENOUS

## 2021-01-03 MED ORDER — SUCCINYLCHOLINE CHLORIDE 200 MG/10ML IV SOSY
PREFILLED_SYRINGE | INTRAVENOUS | Status: AC
  Filled 2021-01-03: qty 20

## 2021-01-03 MED ORDER — LIDOCAINE 2% (20 MG/ML) 5 ML SYRINGE
INTRAMUSCULAR | Status: AC
  Filled 2021-01-03: qty 5

## 2021-01-03 MED ORDER — PROMETHAZINE HCL 25 MG/ML IJ SOLN: 6.2500 mg | INTRAMUSCULAR | Status: DC | PRN

## 2021-01-03 MED ORDER — PHENYLEPHRINE HCL-NACL 10-0.9 MG/250ML-% IV SOLN
INTRAVENOUS | Status: DC | PRN
  Administered 2021-01-03: 15 ug/min via INTRAVENOUS

## 2021-01-03 MED ORDER — PHENYLEPHRINE 40 MCG/ML (10ML) SYRINGE FOR IV PUSH (FOR BLOOD PRESSURE SUPPORT)
PREFILLED_SYRINGE | INTRAVENOUS | Status: AC
  Filled 2021-01-03: qty 20

## 2021-01-03 MED ORDER — ROCURONIUM BROMIDE 10 MG/ML (PF) SYRINGE
PREFILLED_SYRINGE | INTRAVENOUS | Status: AC
  Filled 2021-01-03: qty 20

## 2021-01-03 MED ORDER — ONDANSETRON HCL 4 MG PO TABS: 4.0000 mg | ORAL_TABLET | Freq: Four times a day (QID) | ORAL | Status: DC | PRN

## 2021-01-03 MED ORDER — CHLORHEXIDINE GLUCONATE 0.12 % MT SOLN: 15.0000 mL | Freq: Once | OROMUCOSAL | Status: AC

## 2021-01-03 MED ORDER — ENOXAPARIN SODIUM 40 MG/0.4ML IJ SOSY
40.0000 mg | PREFILLED_SYRINGE | INTRAMUSCULAR | Status: DC
  Administered 2021-01-04 – 2021-01-08 (×4): 40 mg via SUBCUTANEOUS
  Filled 2021-01-03 (×5): qty 0.4

## 2021-01-03 MED ORDER — DOCUSATE SODIUM 100 MG PO CAPS
100.0000 mg | ORAL_CAPSULE | Freq: Two times a day (BID) | ORAL | Status: DC
  Administered 2021-01-03 – 2021-01-08 (×10): 100 mg via ORAL
  Filled 2021-01-03 (×10): qty 1

## 2021-01-03 SURGICAL SUPPLY — 72 items
BANDAGE ESMARK 6X9 LF (GAUZE/BANDAGES/DRESSINGS) ×1 IMPLANT
BAR GLASS FIBER EXFX 11X100 (EXFIX) ×4 IMPLANT
BAR GLASS FIBER EXFX 11X350 (EXFIX) ×4 IMPLANT
BIT DRILL 3.2 X LONG (BIT) ×1
BIT DRILL LONG 2.7 (BIT) ×1 IMPLANT
BIT DRILL X LONG 3.2 (BIT) ×1 IMPLANT
BNDG ELASTIC 2X5.8 VLCR STR LF (GAUZE/BANDAGES/DRESSINGS) ×2 IMPLANT
BNDG ELASTIC 3X5.8 VLCR STR LF (GAUZE/BANDAGES/DRESSINGS) ×2 IMPLANT
BNDG ELASTIC 4X5.8 VLCR STR LF (GAUZE/BANDAGES/DRESSINGS) IMPLANT
BNDG ELASTIC 6X5.8 VLCR STR LF (GAUZE/BANDAGES/DRESSINGS) IMPLANT
BNDG ESMARK 6X9 LF (GAUZE/BANDAGES/DRESSINGS) ×2
BNDG GAUZE ELAST 4 BULKY (GAUZE/BANDAGES/DRESSINGS) ×2 IMPLANT
BRUSH SCRUB EZ PLAIN DRY (MISCELLANEOUS) ×2 IMPLANT
CLAMP BLUE BAR TO BAR (EXFIX) ×4 IMPLANT
CLAMP BLUE BAR TO PIN (EXFIX) ×8 IMPLANT
COVER SURGICAL LIGHT HANDLE (MISCELLANEOUS) ×2 IMPLANT
DRAPE C-ARM 42X72 X-RAY (DRAPES) ×2 IMPLANT
DRAPE C-ARMOR (DRAPES) ×2 IMPLANT
DRAPE U-SHAPE 47X51 STRL (DRAPES) ×2 IMPLANT
DRILL BIT LONG 2.7 (BIT) ×2
DRILL BIT X LONG 3.2 (BIT) ×1
DRSG ADAPTIC 3X8 NADH LF (GAUZE/BANDAGES/DRESSINGS) IMPLANT
DRSG MEPITEL 4X7.2 (GAUZE/BANDAGES/DRESSINGS) ×2 IMPLANT
ELECT REM PT RETURN 9FT ADLT (ELECTROSURGICAL) ×2
ELECTRODE REM PT RTRN 9FT ADLT (ELECTROSURGICAL) ×1 IMPLANT
EVACUATOR 1/8 PVC DRAIN (DRAIN) IMPLANT
GAUZE SPONGE 4X4 12PLY STRL (GAUZE/BANDAGES/DRESSINGS) ×2 IMPLANT
GLOVE BIO SURGEON STRL SZ7.5 (GLOVE) ×4 IMPLANT
GLOVE BIO SURGEON STRL SZ8 (GLOVE) ×4 IMPLANT
GLOVE BIOGEL PI IND STRL 7.5 (GLOVE) ×1 IMPLANT
GLOVE BIOGEL PI IND STRL 9 (GLOVE) ×1 IMPLANT
GLOVE BIOGEL PI INDICATOR 7.5 (GLOVE) ×1
GLOVE BIOGEL PI INDICATOR 9 (GLOVE) ×1
GLOVE SRG 8 PF TXTR STRL LF DI (GLOVE) ×1 IMPLANT
GLOVE SURG UNDER POLY LF SZ8 (GLOVE) ×1
GOWN STRL REUS W/ TWL LRG LVL3 (GOWN DISPOSABLE) ×1 IMPLANT
GOWN STRL REUS W/ TWL XL LVL3 (GOWN DISPOSABLE) ×2 IMPLANT
GOWN STRL REUS W/TWL LRG LVL3 (GOWN DISPOSABLE) ×1
GOWN STRL REUS W/TWL XL LVL3 (GOWN DISPOSABLE) ×2
HALF PIN 3MM (EXFIX) ×4 IMPLANT
HANDPIECE INTERPULSE COAX TIP (DISPOSABLE) ×1
KIT BASIN OR (CUSTOM PROCEDURE TRAY) ×2 IMPLANT
KIT TURNOVER KIT B (KITS) ×2 IMPLANT
MANIFOLD NEPTUNE II (INSTRUMENTS) ×2 IMPLANT
NAIL ELASTIC TI 2.0MM (Nail) ×4 IMPLANT
NAIL TI ELASTIC 2.5MM (Nail) ×2 IMPLANT
NS IRRIG 1000ML POUR BTL (IV SOLUTION) ×2 IMPLANT
PACK ORTHO EXTREMITY (CUSTOM PROCEDURE TRAY) ×2 IMPLANT
PAD ARMBOARD 7.5X6 YLW CONV (MISCELLANEOUS) ×4 IMPLANT
PAD CAST 3X4 CTTN HI CHSV (CAST SUPPLIES) ×1 IMPLANT
PADDING CAST COTTON 3X4 STRL (CAST SUPPLIES) ×1
PADDING CAST COTTON 6X4 STRL (CAST SUPPLIES) IMPLANT
PADDING UNDERCAST 2 STRL (CAST SUPPLIES) ×2
PADDING UNDERCAST 2X4 STRL (CAST SUPPLIES) ×2 IMPLANT
PIN 3MM (EXFIX) IMPLANT
PIN CLAMP 2BAR 75MM BLUE (EXFIX) ×2 IMPLANT
PIN HALF YELLOW 5X160X35 (EXFIX) ×4 IMPLANT
PIN TRANSFIXING 5.0 (EXFIX) ×2 IMPLANT
SET HNDPC FAN SPRY TIP SCT (DISPOSABLE) ×1 IMPLANT
SOL PREP POV-IOD 4OZ 10% (MISCELLANEOUS) ×2 IMPLANT
SOL PREP PROV IODINE SCRUB 4OZ (MISCELLANEOUS) ×2 IMPLANT
SPONGE LAP 18X18 RF (DISPOSABLE) ×4 IMPLANT
STAPLER VISISTAT 35W (STAPLE) IMPLANT
STRIP CLOSURE SKIN 1/2X4 (GAUZE/BANDAGES/DRESSINGS) IMPLANT
SUT ETHILON 2 0 FS 18 (SUTURE) ×2 IMPLANT
SUT PDS AB 2-0 CT1 27 (SUTURE) ×2 IMPLANT
TOWEL GREEN STERILE (TOWEL DISPOSABLE) ×2 IMPLANT
TOWEL GREEN STERILE FF (TOWEL DISPOSABLE) ×2 IMPLANT
TUBE CONNECTING 12X1/4 (SUCTIONS) ×2 IMPLANT
UNDERPAD 30X36 HEAVY ABSORB (UNDERPADS AND DIAPERS) ×2 IMPLANT
WATER STERILE IRR 1000ML POUR (IV SOLUTION) ×2 IMPLANT
YANKAUER SUCT BULB TIP NO VENT (SUCTIONS) ×2 IMPLANT

## 2021-01-03 NOTE — Progress Notes (Signed)
Pt. States dentures/partial left at home.

## 2021-01-03 NOTE — Transfer of Care (Signed)
Immediate Anesthesia Transfer of Care Note  Patient: Margaret Washington  Procedure(s) Performed: EXTERNAL FIXATION ANKLE (Right ) IRRIGATION AND DEBRIDEMENT ANKLE (Right ) INTRAMEDULLARY (IM) NAIL FIBULA (Right )  Patient Location: PACU  Anesthesia Type:General  Level of Consciousness: drowsy  Airway & Oxygen Therapy: Patient Spontanous Breathing and Patient connected to face mask oxygen  Post-op Assessment: Report given to RN and Post -op Vital signs reviewed and stable  Post vital signs: Reviewed and stable  Last Vitals:  Vitals Value Taken Time  BP 100/49 01/03/21 1715  Temp 36.2 C 01/03/21 1715  Pulse 90 01/03/21 1725  Resp 18 01/03/21 1725  SpO2 100 % 01/03/21 1725  Vitals shown include unvalidated device data.  Last Pain:  Vitals:   01/03/21 1715  TempSrc:   PainSc: Asleep      Patients Stated Pain Goal: 5 (51/02/58 5277)  Complications: No complications documented.

## 2021-01-03 NOTE — ED Provider Notes (Signed)
Patient had CT scan of the head, cervical spine, chest abdomen pelvis.  There is a question of a T3 endplate fracture although it was undetermined whether this is old or new.  She does not have any tenderness on exam to this area.  Otherwise there is no evidence of traumatic injury.  I spoke with the PA with the trauma service to is okay with orthopedics admitting to their service.  We are currently awaiting Dr. Marcelino Scot to evaluate the patient and anticipate admission with OR washout/ fixtation   Malvin Johns, MD 01/03/21 [phone]
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
## 2021-01-03 NOTE — Progress Notes (Signed)
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 spanning external fixator with follow-up CT scan  to evaluate for any joint communication of fracture planes  Possible return to the OR early next week for definitive fixation  Will be nonweightbearing for 8 weeks after definitive fixation   Risks and benefits reviewed with the patient and she understands the need for urgent surgical intervention  -Questionable acute versus chronic acute T3 superior endplate compression fracture  Patient did have pain in the thoracic region with palpation.  Will reevaluate postoperatively and order MRI if pain persists.    - Pain management:  Pain management will likely be complex given her substance abuse history.  She has been clean for quite some time now and is on Suboxone however we will treat her pain with acute narcotics.  We will reach out to her pain  management doctor to make them aware so as not to compromise her pain management contract.  Hopeful she will only require a short course of acute opiates and then we can get her back to her regular Suboxone regimen  - ABL anemia/Hemodynamics  Monitor  - Medical issues   Suboxone therapy   Hold Suboxone for now  - DVT/PE prophylaxis:  SCDs  Lovenox postop - ID:   Open fracture protocol  Duration and agent to be determined after debridement  - Metabolic Bone Disease:  Check basic labs given long-term opiate use  - Activity:  Bedrest for now  - FEN/GI prophylaxis/Foley/Lines:  N.p.o. for now  Regular diet postop  No pure wick postop-patient can mobilize to bedside commode, bathroom or use bedpan  - Impediments to fracture healing:  Open fracture  Chronic opiate treatment  - Dispo:  OR today for irrigation debridement right lower leg, application of spanning external fixator right ankle    Margaret Pigg, PA-C [phone] (C) 01/03/2021, 9:27 AM  [facility] [place] [id number] [phone] Jenetta [phone] (F)    After 5pm and on the weekends please log on to Amion, go to orthopaedics and the look under the [facility] Call for the provider(s) on call. You can also call our office at [phone] and then follow the prompts to be connected to the call team.

## 2021-01-03 NOTE — ED Notes (Signed)
Patient care handed off to Pre Op PACU team. Dr Marcelino Scot at bedside at time of care transfer. Patient with Stable VS. Ancef ready for pre op. All belongings in patient bag labeled and with patient.

## 2021-01-03 NOTE — ED Notes (Signed)
Provider made aware to please pick up pt.

## 2021-01-03 NOTE — ED Notes (Signed)
Patient transported to CT 

## 2021-01-03 NOTE — Anesthesia Postprocedure Evaluation (Signed)
Anesthesia Post Note  Patient: Jenkins Rouge  Procedure(s) Performed: EXTERNAL FIXATION ANKLE (Right ) IRRIGATION AND DEBRIDEMENT ANKLE (Right ) INTRAMEDULLARY (IM) NAIL FIBULA (Right )     Patient location during evaluation: PACU Anesthesia Type: General Level of consciousness: awake Pain management: pain level controlled Vital Signs Assessment: post-procedure vital signs reviewed and stable Respiratory status: spontaneous breathing, nonlabored ventilation, respiratory function stable and patient connected to nasal cannula oxygen Cardiovascular status: blood pressure returned to baseline and stable Postop Assessment: no apparent nausea or vomiting Anesthetic complications: no   No complications documented.  Last Vitals:  Vitals:   01/03/21 1815 01/03/21 1847  BP: 118/81 128/80  Pulse: 78 80  Resp: 20 18  Temp: 36.4 C 36.7 C  SpO2: 98% 100%    Last Pain:  Vitals:   01/03/21 1905  TempSrc:   PainSc: 10-Worst pain ever                 Dametria Tuzzolino P Skip Litke

## 2021-01-03 NOTE — Anesthesia Preprocedure Evaluation (Addendum)
Anesthesia Evaluation  Patient identified by MRN, date of birth, ID band Patient awake    Reviewed: Allergy & Precautions, NPO status , Patient's Chart, lab work & pertinent test results  Airway Mallampati: I  TM Distance: >3 FB Neck ROM: Full    Dental  (+) Edentulous Upper, Missing   Pulmonary Current Smoker and Patient abstained from smoking.,    Pulmonary exam normal breath sounds clear to auscultation       Cardiovascular negative cardio ROS Normal cardiovascular exam Rhythm:Regular Rate:Normal  ECG: SR, rate 97   Neuro/Psych PSYCHIATRIC DISORDERS Anxiety negative neurological ROS     GI/Hepatic negative GI ROS, (+)     substance abuse  ,   Endo/Other  negative endocrine ROS  Renal/GU negative Renal ROS     Musculoskeletal negative musculoskeletal ROS (+) narcotic dependent  Abdominal   Peds  (+) ATTENTION DEFICIT DISORDER WITHOUT HYPERACTIVITY Hematology  (+) anemia ,   Anesthesia Other Findings OPEN ANKLE FX RIGHT  Reproductive/Obstetrics                          Anesthesia Physical Anesthesia Plan  ASA: II  Anesthesia Plan: General   Post-op Pain Management:    Induction: Intravenous  PONV Risk Score and Plan: 2 and Ondansetron, Dexamethasone, Midazolam and Treatment may vary due to age or medical condition  Airway Management Planned: Oral ETT  Additional Equipment:   Intra-op Plan:   Post-operative Plan: Extubation in OR  Informed Consent: I have reviewed the patients History and Physical, chart, labs and discussed the procedure including the risks, benefits and alternatives for the proposed anesthesia with the patient or authorized representative who has indicated his/her understanding and acceptance.     Dental advisory given  Plan Discussed with: CRNA  Anesthesia Plan Comments:         Anesthesia Quick Evaluation

## 2021-01-03 NOTE — ED Notes (Signed)
ED Provider at bedside. 
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
## 2021-01-03 NOTE — Progress Notes (Signed)
Orthopedic Tech Progress Note Patient Details:  Margaret Washington 1976/03/29 [id number]  Ortho Devices Type of Ortho Device: Stirrup splint,Short leg splint Ortho Device/Splint Location: LLE Ortho Device/Splint Interventions: Application,Ordered   Post Interventions Patient Tolerated: Well   Eliska Hamil A Reinhold Rickey 01/03/2021, 11:04 AM Splinted patient as is. PA paul requested splin until OR

## 2021-01-03 NOTE — Anesthesia Procedure Notes (Signed)
Procedure Name: Intubation Performed by: Kyung Rudd, CRNA Pre-anesthesia Checklist: Patient identified, Emergency Drugs available, Suction available and Patient being monitored Patient Re-evaluated:Patient Re-evaluated prior to induction Oxygen Delivery Method: Circle system utilized Preoxygenation: Pre-oxygenation with 100% oxygen Induction Type: IV induction Ventilation: Mask ventilation without difficulty Laryngoscope Size: Mac and 3 Grade View: Grade I Tube type: Oral Tube size: 7.0 mm Number of attempts: 1 Airway Equipment and Method: Stylet Placement Confirmation: ETT inserted through vocal cords under direct vision,  positive ETCO2 and breath sounds checked- equal and bilateral Secured at: 20 cm Tube secured with: Tape Dental Injury: Teeth and Oropharynx as per pre-operative assessment

## 2021-01-04 ENCOUNTER — Other Ambulatory Visit: Payer: Self-pay

## 2021-01-04 ENCOUNTER — Inpatient Hospital Stay (HOSPITAL_COMMUNITY): Payer: Medicaid Other

## 2021-01-04 LAB — CBC
HCT: 29.4 % — ABNORMAL LOW (ref 36.0–46.0)
Hemoglobin: 9.7 g/dL — ABNORMAL LOW (ref 12.0–15.0)
MCH: 27.2 pg (ref 26.0–34.0)
MCHC: 33 g/dL (ref 30.0–36.0)
MCV: 82.4 fL (ref 80.0–100.0)
Platelets: 275 10*3/uL (ref 150–400)
RBC: 3.57 MIL/uL — ABNORMAL LOW (ref 3.87–5.11)
RDW: 15.8 % — ABNORMAL HIGH (ref 11.5–15.5)
WBC: 13.2 10*3/uL — ABNORMAL HIGH (ref 4.0–10.5)
nRBC: 0 % (ref 0.0–0.2)

## 2021-01-04 LAB — BASIC METABOLIC PANEL
Anion gap: 9 (ref 5–15)
BUN: 5 mg/dL — ABNORMAL LOW (ref 6–20)
CO2: 21 mmol/L — ABNORMAL LOW (ref 22–32)
Calcium: 8.5 mg/dL — ABNORMAL LOW (ref 8.9–10.3)
Chloride: 106 mmol/L (ref 98–111)
Creatinine, Ser: 0.52 mg/dL (ref 0.44–1.00)
GFR, Estimated: >60 mL/min (ref >60)
Glucose, Bld: 132 mg/dL — ABNORMAL HIGH (ref 70–99)
Potassium: 3.8 mmol/L (ref 3.5–5.1)
Sodium: 136 mmol/L (ref 135–145)

## 2021-01-04 NOTE — Progress Notes (Signed)
PT Cancellation Note  Patient Details Name: Margaret Washington MRN: [id number] DOB: 1975-12-05   Cancelled Treatment:    Reason Eval/Treat Not Completed: Pain limiting ability to participate   Discussed pt with OT, who conveyed that pt is telling OT that she is in too much pain at this time to participate;   Will follow up later today as time allows;  Otherwise, will follow up for PT tomorrow;   Thank you,  Roney Marion, PT  Acute Rehabilitation Services Pager [phone] Office [phone]     [name] 01/04/2021, 2:21 PM

## 2021-01-04 NOTE — Plan of Care (Signed)

## 2021-01-04 NOTE — Progress Notes (Signed)
OT Cancellation Note  Patient Details Name: Margaret Washington MRN: [id number] DOB: 04-Oct-1975   Cancelled Treatment:    Reason Eval/Treat Not Completed: Other (comment). Pt reports she is in too much pain to do anything right now and pain meds not due for ~ 1 (po) and 2 (iv)more hours. Pt reports she did sit up on side of bed a couple of times today and it was very painful. Pt agreed that she would get up tomorrow with therapy post IV pain meds.  Golden Circle, OTR/L Acute Rehab Services Pager [phone] Office [phone]     Almon Register 01/04/2021, 2:25 PM

## 2021-01-04 NOTE — Progress Notes (Signed)
ORTHOPAEDIC PROGRESS NOTE  s/p Procedure(s): EXTERNAL FIXATION ANKLE IRRIGATION AND DEBRIDEMENT ANKLE INTRAMEDULLARY (IM) NAIL FIBULA on 4/30 with Dr. Marcelino Scot  SUBJECTIVE: Reports pain about operative site. No chest pain. No SOB. No nausea/vomiting. No other complaints.  OBJECTIVE: PE: General: sitting up in hospital bed, NAD Cardiac: regular rate Pulmonary: No increased work of breathing RLE: ex-fix in place. Pin sites and dressings CDI. Compartments soft and compressible. Endorses distal sensation. Wiggles toes slightly. Warm well perfused foot.   Vitals:   01/04/21 0500 01/04/21 0806  BP: 118/75 116/66  Pulse: 80 (!) 105  Resp: 18 18  Temp: 98 F (36.7 C) 98.4 F (36.9 C)  SpO2: 100% 99%    ASSESSMENT: Margaret Washington is a 45 y.o. female doing well postoperatively. POD#1  PLAN: Weightbearing: NWB RLE Insicional and dressing care: Reinforce dressings as needed Orthopedic device(s): Ex-fix Showering: Hold for now VTE prophylaxis: Lovenox Pain control: PRN pain medications Follow - up plan: Plan to return to OR on Tuesday  Contact information: After hours and holidays please check [email or web address] for group call information for [facility]   Noemi Chapel, PA-C 01/04/2021

## 2021-01-05 ENCOUNTER — Encounter (HOSPITAL_COMMUNITY): Payer: Self-pay | Admitting: Orthopedic Surgery

## 2021-01-05 ENCOUNTER — Inpatient Hospital Stay (HOSPITAL_COMMUNITY): Payer: Medicaid Other

## 2021-01-05 DIAGNOSIS — F1190 Opioid use, unspecified, uncomplicated: Secondary | ICD-10-CM

## 2021-01-05 LAB — BASIC METABOLIC PANEL
Anion gap: 6 (ref 5–15)
BUN: <5 mg/dL — ABNORMAL LOW (ref 6–20)
CO2: 25 mmol/L (ref 22–32)
Calcium: 7.9 mg/dL — ABNORMAL LOW (ref 8.9–10.3)
Chloride: 110 mmol/L (ref 98–111)
Creatinine, Ser: 0.51 mg/dL (ref 0.44–1.00)
GFR, Estimated: >60 mL/min (ref >60)
Glucose, Bld: 97 mg/dL (ref 70–99)
Potassium: 3.7 mmol/L (ref 3.5–5.1)
Sodium: 141 mmol/L (ref 135–145)

## 2021-01-05 LAB — SURGICAL PCR SCREEN
MRSA, PCR: NEGATIVE
Staphylococcus aureus: NEGATIVE

## 2021-01-05 MED ORDER — HYDROMORPHONE HCL 1 MG/ML IJ SOLN
0.2500 mg | Freq: Two times a day (BID) | INTRAMUSCULAR | Status: DC | PRN
  Administered 2021-01-05 – 2021-01-06 (×2): 0.5 mg via INTRAVENOUS
  Filled 2021-01-05 (×3): qty 0.5

## 2021-01-05 MED ORDER — HYDROMORPHONE HCL 2 MG PO TABS
6.0000 mg | ORAL_TABLET | ORAL | Status: DC | PRN
  Administered 2021-01-05 – 2021-01-07 (×7): 8 mg via ORAL
  Administered 2021-01-07: 6 mg via ORAL
  Administered 2021-01-07 – 2021-01-08 (×5): 8 mg via ORAL
  Filled 2021-01-05 (×4): qty 4
  Filled 2021-01-05: qty 3
  Filled 2021-01-05 (×8): qty 4

## 2021-01-05 MED ORDER — CEFAZOLIN SODIUM-DEXTROSE 2-4 GM/100ML-% IV SOLN
2.0000 g | INTRAVENOUS | Status: DC
  Filled 2021-01-05: qty 100

## 2021-01-05 MED ORDER — HYDROMORPHONE HCL 2 MG PO TABS
2.0000 mg | ORAL_TABLET | ORAL | Status: DC | PRN
  Filled 2021-01-05 (×2): qty 2

## 2021-01-05 NOTE — TOC CAGE-AID Note (Signed)
Transition of Care [facility]) - CAGE-AID Screening   Patient Details  Name: Margaret Washington MRN: [id number] Date of Birth: September 28, 1975  Transition [facility]) CM/SW Contact:    Bethann Berkshire, [place] Phone Number: 01/05/2021, 10:23 AM   Clinical Narrative:  CAGE-AID completed; pt reports she does not drink alcohol or use any other substances.   CAGE-AID Screening:    Have You Ever Felt You Ought to Cut Down on Your Drinking or Drug Use?: No Have People Annoyed You By Critizing Your Drinking Or Drug Use?: No Have You Felt Bad Or Guilty About Your Drinking Or Drug Use?: No Have You Ever Had a Drink or Used Drugs First Thing In The Morning to Steady Your Nerves or to Get Rid of a Hangover?: No CAGE-AID Score: 0  Substance Abuse Education Offered: No
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
## 2021-01-05 NOTE — Plan of Care (Signed)
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
## 2021-01-05 NOTE — Progress Notes (Signed)
Patient is requesting IV & PO form for pain level that is NOT indicated for route/dose. Several serious conversations were had about only medicating her based on her pain level. She has been requesting PO/IV pain medication q 2-2.5hrs overnight.  She refused PT/OT yesterday d/t pain, but was medicated for pain throughout the day. Over night she has requested external urine catheter, and request is denied. She is encouraged to mobilize and use bedpan. She purposely holds urine at times.

## 2021-01-06 ENCOUNTER — Inpatient Hospital Stay (HOSPITAL_COMMUNITY): Payer: Medicaid Other | Admitting: Certified Registered"

## 2021-01-06 ENCOUNTER — Encounter (HOSPITAL_COMMUNITY)
Admission: EM | Disposition: A | Discharge status: Home / Self Care | Payer: Self-pay | Source: Home / Self Care | Attending: Orthopedic Surgery

## 2021-01-06 ENCOUNTER — Inpatient Hospital Stay (HOSPITAL_COMMUNITY): Payer: Medicaid Other

## 2021-01-06 ENCOUNTER — Encounter (HOSPITAL_COMMUNITY): Payer: Self-pay | Admitting: Orthopedic Surgery

## 2021-01-06 HISTORY — PX: EXTERNAL FIXATION LEG: SHX1549

## 2021-01-06 HISTORY — PX: ORIF ANKLE FRACTURE: SHX5408

## 2021-01-06 LAB — CBC
HCT: 29.6 % — ABNORMAL LOW (ref 36.0–46.0)
Hemoglobin: 9.7 g/dL — ABNORMAL LOW (ref 12.0–15.0)
MCH: 27.2 pg (ref 26.0–34.0)
MCHC: 32.8 g/dL (ref 30.0–36.0)
MCV: 83.1 fL (ref 80.0–100.0)
Platelets: 268 10*3/uL (ref 150–400)
RBC: 3.56 MIL/uL — ABNORMAL LOW (ref 3.87–5.11)
RDW: 16 % — ABNORMAL HIGH (ref 11.5–15.5)
WBC: 11.2 10*3/uL — ABNORMAL HIGH (ref 4.0–10.5)
nRBC: 0 % (ref 0.0–0.2)

## 2021-01-06 LAB — BASIC METABOLIC PANEL
Anion gap: 8 (ref 5–15)
BUN: <5 mg/dL — ABNORMAL LOW (ref 6–20)
CO2: 23 mmol/L (ref 22–32)
Calcium: 8.2 mg/dL — ABNORMAL LOW (ref 8.9–10.3)
Chloride: 105 mmol/L (ref 98–111)
Creatinine, Ser: 0.4 mg/dL — ABNORMAL LOW (ref 0.44–1.00)
GFR, Estimated: >60 mL/min (ref >60)
Glucose, Bld: 91 mg/dL (ref 70–99)
Potassium: 3.9 mmol/L (ref 3.5–5.1)
Sodium: 136 mmol/L (ref 135–145)

## 2021-01-06 LAB — VITAMIN D 25 HYDROXY (VIT D DEFICIENCY, FRACTURES): Vit D, 25-Hydroxy: 11.17 ng/mL — ABNORMAL LOW (ref 30–100)

## 2021-01-06 LAB — TYPE AND SCREEN
ABO/RH(D): A POS
Antibody Screen: NEGATIVE

## 2021-01-06 SURGERY — OPEN REDUCTION INTERNAL FIXATION (ORIF) ANKLE FRACTURE
Anesthesia: Regional | Site: Ankle | Laterality: Right

## 2021-01-06 MED ORDER — SUGAMMADEX SODIUM 200 MG/2ML IV SOLN
INTRAVENOUS | Status: DC | PRN
  Administered 2021-01-06: 200 mg via INTRAVENOUS

## 2021-01-06 MED ORDER — PROPOFOL 10 MG/ML IV BOLUS
INTRAVENOUS | Status: DC | PRN
  Administered 2021-01-06: 120 mg via INTRAVENOUS

## 2021-01-06 MED ORDER — LIDOCAINE 2% (20 MG/ML) 5 ML SYRINGE
INTRAMUSCULAR | Status: AC
  Filled 2021-01-06: qty 5

## 2021-01-06 MED ORDER — PROPOFOL 10 MG/ML IV BOLUS
INTRAVENOUS | Status: AC
  Filled 2021-01-06: qty 20

## 2021-01-06 MED ORDER — ONDANSETRON HCL 4 MG/2ML IJ SOLN
INTRAMUSCULAR | Status: DC | PRN
  Administered 2021-01-06: 4 mg via INTRAVENOUS

## 2021-01-06 MED ORDER — ROCURONIUM BROMIDE 10 MG/ML (PF) SYRINGE
PREFILLED_SYRINGE | INTRAVENOUS | Status: DC | PRN
  Administered 2021-01-06: 50 mg via INTRAVENOUS
  Administered 2021-01-06: 20 mg via INTRAVENOUS

## 2021-01-06 MED ORDER — MIDAZOLAM HCL 2 MG/2ML IJ SOLN: 2.0000 mg | Freq: Once | INTRAMUSCULAR | Status: AC

## 2021-01-06 MED ORDER — ONDANSETRON HCL 4 MG/2ML IJ SOLN: 4.0000 mg | Freq: Once | INTRAMUSCULAR | Status: DC | PRN

## 2021-01-06 MED ORDER — ORAL CARE MOUTH RINSE: 15.0000 mL | Freq: Once | OROMUCOSAL | Status: AC

## 2021-01-06 MED ORDER — FENTANYL CITRATE (PF) 100 MCG/2ML IJ SOLN: 25.0000 ug | INTRAMUSCULAR | Status: DC | PRN

## 2021-01-06 MED ORDER — PHENYLEPHRINE HCL-NACL 10-0.9 MG/250ML-% IV SOLN
INTRAVENOUS | Status: DC | PRN
  Administered 2021-01-06: 20 ug/min via INTRAVENOUS

## 2021-01-06 MED ORDER — HYDROMORPHONE HCL 1 MG/ML IJ SOLN: 0.2500 mg | INTRAMUSCULAR | Status: DC | PRN

## 2021-01-06 MED ORDER — FENTANYL CITRATE (PF) 250 MCG/5ML IJ SOLN
INTRAMUSCULAR | Status: AC
  Filled 2021-01-06: qty 5

## 2021-01-06 MED ORDER — LACTATED RINGERS IV SOLN: INTRAVENOUS | Status: DC | PRN

## 2021-01-06 MED ORDER — FENTANYL CITRATE (PF) 250 MCG/5ML IJ SOLN
INTRAMUSCULAR | Status: DC | PRN
  Administered 2021-01-06 (×2): 50 ug via INTRAVENOUS
  Administered 2021-01-06: 100 ug via INTRAVENOUS

## 2021-01-06 MED ORDER — LACTATED RINGERS IV SOLN: INTRAVENOUS | Status: DC

## 2021-01-06 MED ORDER — ONDANSETRON HCL 4 MG/2ML IJ SOLN
INTRAMUSCULAR | Status: AC
  Filled 2021-01-06: qty 2

## 2021-01-06 MED ORDER — KETAMINE HCL 50 MG/5ML IJ SOSY
PREFILLED_SYRINGE | INTRAMUSCULAR | Status: AC
  Filled 2021-01-06: qty 5

## 2021-01-06 MED ORDER — CHLORHEXIDINE GLUCONATE 0.12 % MT SOLN
OROMUCOSAL | Status: AC
  Administered 2021-01-06: 15 mL via OROMUCOSAL
  Filled 2021-01-06: qty 15

## 2021-01-06 MED ORDER — KETOROLAC TROMETHAMINE 30 MG/ML IJ SOLN
INTRAMUSCULAR | Status: AC
  Filled 2021-01-06: qty 1

## 2021-01-06 MED ORDER — MIDAZOLAM HCL 2 MG/2ML IJ SOLN
INTRAMUSCULAR | Status: AC
  Filled 2021-01-06: qty 2

## 2021-01-06 MED ORDER — MIDAZOLAM HCL 2 MG/2ML IJ SOLN
INTRAMUSCULAR | Status: AC
  Administered 2021-01-06: 2 mg via INTRAVENOUS
  Filled 2021-01-06: qty 2

## 2021-01-06 MED ORDER — FENTANYL CITRATE (PF) 100 MCG/2ML IJ SOLN
INTRAMUSCULAR | Status: AC
  Administered 2021-01-06: 100 ug via INTRAVENOUS
  Filled 2021-01-06: qty 2

## 2021-01-06 MED ORDER — ROCURONIUM BROMIDE 10 MG/ML (PF) SYRINGE
PREFILLED_SYRINGE | INTRAVENOUS | Status: AC
  Filled 2021-01-06: qty 30

## 2021-01-06 MED ORDER — FENTANYL CITRATE (PF) 100 MCG/2ML IJ SOLN
100.0000 ug | Freq: Once | INTRAMUSCULAR | Status: AC
Start: 2021-01-06 — End: 2021-01-06

## 2021-01-06 MED ORDER — KETAMINE HCL 10 MG/ML IJ SOLN
INTRAMUSCULAR | Status: DC | PRN
  Administered 2021-01-06: 10 mg via INTRAVENOUS
  Administered 2021-01-06: 20 mg via INTRAVENOUS

## 2021-01-06 MED ORDER — KETOROLAC TROMETHAMINE 30 MG/ML IJ SOLN
INTRAMUSCULAR | Status: DC | PRN
  Administered 2021-01-06: 30 mg via INTRAVENOUS

## 2021-01-06 MED ORDER — DEXAMETHASONE SODIUM PHOSPHATE 10 MG/ML IJ SOLN
INTRAMUSCULAR | Status: AC
  Filled 2021-01-06: qty 1

## 2021-01-06 MED ORDER — CEFAZOLIN SODIUM-DEXTROSE 2-3 GM-%(50ML) IV SOLR
INTRAVENOUS | Status: DC | PRN
  Administered 2021-01-06: 2 g via INTRAVENOUS

## 2021-01-06 MED ORDER — DEXAMETHASONE SODIUM PHOSPHATE 10 MG/ML IJ SOLN
INTRAMUSCULAR | Status: DC | PRN
  Administered 2021-01-06: 10 mg via INTRAVENOUS

## 2021-01-06 MED ORDER — DEXMEDETOMIDINE (PRECEDEX) IN NS 20 MCG/5ML (4 MCG/ML) IV SYRINGE
PREFILLED_SYRINGE | INTRAVENOUS | Status: DC | PRN
  Administered 2021-01-06: 8 ug via INTRAVENOUS

## 2021-01-06 MED ORDER — 0.9 % SODIUM CHLORIDE (POUR BTL) OPTIME
TOPICAL | Status: DC | PRN
  Administered 2021-01-06: 1000 mL

## 2021-01-06 MED ORDER — CHLORHEXIDINE GLUCONATE 0.12 % MT SOLN: 15.0000 mL | Freq: Once | OROMUCOSAL | Status: AC

## 2021-01-06 SURGICAL SUPPLY — 94 items
BANDAGE ESMARK 6X9 LF (GAUZE/BANDAGES/DRESSINGS) ×2 IMPLANT
BIT DRILL 12 (BIT) ×1
BIT DRILL 190X12XCANN LRG QC (BIT) ×2 IMPLANT
BIT DRILL 2.5 X LONG (BIT) ×2
BIT DRILL LCP QC 2X140 (BIT) ×3 IMPLANT
BIT DRILL X LONG 2.5 (BIT) ×2 IMPLANT
BIT DRL 190X12XCANN LRG QC (BIT) ×2
BNDG COHESIVE 6X5 TAN STRL LF (GAUZE/BANDAGES/DRESSINGS) IMPLANT
BNDG ELASTIC 4X5.8 VLCR STR LF (GAUZE/BANDAGES/DRESSINGS) ×3 IMPLANT
BNDG ELASTIC 6X5.8 VLCR STR LF (GAUZE/BANDAGES/DRESSINGS) ×6 IMPLANT
BNDG ESMARK 6X9 LF (GAUZE/BANDAGES/DRESSINGS) ×3
BNDG GAUZE ELAST 4 BULKY (GAUZE/BANDAGES/DRESSINGS) ×6 IMPLANT
BRUSH SCRUB EZ PLAIN DRY (MISCELLANEOUS) ×6 IMPLANT
CATH FOLEY LATEX FREE 14FR (CATHETERS) ×1
CATH FOLEY LF 14FR (CATHETERS) ×2 IMPLANT
COVER MAYO STAND STRL (DRAPES) ×3 IMPLANT
COVER SURGICAL LIGHT HANDLE (MISCELLANEOUS) ×6 IMPLANT
COVER WAND RF STERILE (DRAPES) ×3 IMPLANT
CUFF TOURN SGL QUICK 18X4 (TOURNIQUET CUFF) IMPLANT
CUFF TOURN SGL QUICK 34 (TOURNIQUET CUFF) ×1
CUFF TRNQT CYL 34X4.125X (TOURNIQUET CUFF) ×2 IMPLANT
DRAPE C-ARM 42X72 X-RAY (DRAPES) ×3 IMPLANT
DRAPE C-ARMOR (DRAPES) ×3 IMPLANT
DRAPE HALF SHEET 40X57 (DRAPES) ×3 IMPLANT
DRAPE INCISE IOBAN 66X45 STRL (DRAPES) ×3 IMPLANT
DRAPE U-SHAPE 47X51 STRL (DRAPES) ×3 IMPLANT
DRESSING MEPILEX FLEX 4X4 (GAUZE/BANDAGES/DRESSINGS) ×2 IMPLANT
DRILL BIT X LONG 2.5 (BIT) ×1
DRSG ADAPTIC 3X8 NADH LF (GAUZE/BANDAGES/DRESSINGS) ×3 IMPLANT
DRSG EMULSION OIL 3X3 NADH (GAUZE/BANDAGES/DRESSINGS) IMPLANT
DRSG MEPILEX FLEX 4X4 (GAUZE/BANDAGES/DRESSINGS) ×3
DRSG MEPITEL 4X7.2 (GAUZE/BANDAGES/DRESSINGS) IMPLANT
DRSG PAD ABDOMINAL 8X10 ST (GAUZE/BANDAGES/DRESSINGS) ×3 IMPLANT
ELECT REM PT RETURN 9FT ADLT (ELECTROSURGICAL) ×3
ELECTRODE REM PT RTRN 9FT ADLT (ELECTROSURGICAL) ×2 IMPLANT
EVACUATOR 1/8 PVC DRAIN (DRAIN) IMPLANT
GAUZE SPONGE 4X4 12PLY STRL (GAUZE/BANDAGES/DRESSINGS) ×6 IMPLANT
GLOVE BIO SURGEON STRL SZ7.5 (GLOVE) ×3 IMPLANT
GLOVE BIO SURGEON STRL SZ8 (GLOVE) ×3 IMPLANT
GLOVE BIOGEL PI IND STRL 7.5 (GLOVE) ×2 IMPLANT
GLOVE BIOGEL PI INDICATOR 7.5 (GLOVE) ×1
GLOVE SRG 8 PF TXTR STRL LF DI (GLOVE) ×2 IMPLANT
GLOVE SURG UNDER POLY LF SZ8 (GLOVE) ×1
GOWN STRL REUS W/ TWL LRG LVL3 (GOWN DISPOSABLE) ×4 IMPLANT
GOWN STRL REUS W/ TWL XL LVL3 (GOWN DISPOSABLE) ×2 IMPLANT
GOWN STRL REUS W/TWL LRG LVL3 (GOWN DISPOSABLE) ×2
GOWN STRL REUS W/TWL XL LVL3 (GOWN DISPOSABLE) ×1
GUIDEWIRE 3.2X400 (WIRE) ×3 IMPLANT
KIT BASIN OR (CUSTOM PROCEDURE TRAY) ×3 IMPLANT
KIT BONE HARVEST RIA 2 (ORTHOPEDIC DISPOSABLE SUPPLIES) ×3 IMPLANT
KIT TURNOVER KIT B (KITS) ×3 IMPLANT
MANIFOLD NEPTUNE II (INSTRUMENTS) ×3 IMPLANT
NEEDLE 22X1 1/2 (OR ONLY) (NEEDLE) IMPLANT
NEEDLE HYPO 21X1.5 SAFETY (NEEDLE) IMPLANT
NS IRRIG 1000ML POUR BTL (IV SOLUTION) ×3 IMPLANT
PACK GENERAL/GYN (CUSTOM PROCEDURE TRAY) ×3 IMPLANT
PACK ORTHO EXTREMITY (CUSTOM PROCEDURE TRAY) ×3 IMPLANT
PAD ARMBOARD 7.5X6 YLW CONV (MISCELLANEOUS) ×6 IMPLANT
PAD CAST 4YDX4 CTTN HI CHSV (CAST SUPPLIES) IMPLANT
PADDING CAST COTTON 4X4 STRL (CAST SUPPLIES)
PADDING CAST COTTON 6X4 STRL (CAST SUPPLIES) ×6 IMPLANT
PLATE COMPRESSION 6H 2.7/3.5 (Plate) ×3 IMPLANT
REAMER HEAD RIA2 10.0 DISP (ORTHOPEDIC DISPOSABLE SUPPLIES) ×3 IMPLANT
REAMER ROD DEEP FLUTE 2.5X950 (INSTRUMENTS) ×3 IMPLANT
RIA 2 BONE HARVEST KIT 520MM STERILE ×3 IMPLANT
SCREW 3.5X22MM (Screw) ×3 IMPLANT
SCREW CORT 3.5X46M SELF TAP (Screw) ×6 IMPLANT
SCREW CORT HEADED ST 3.5X24 (Screw) ×3 IMPLANT
SCREW CORT HEADED ST 3.5X26 (Screw) ×3 IMPLANT
SCREW CORT ST 2.7X44 (Screw) ×3 IMPLANT
SCREW LOCKING 2.7X28 (Screw) ×3 IMPLANT
SCREW LOCKING 2.7X44MM VA (Screw) ×9 IMPLANT
SCREW LOCKING VA 2.7X38 (Screw) ×3 IMPLANT
SCREW LOCKING VA 2.7X40MM (Screw) ×6 IMPLANT
SPONGE LAP 18X18 RF (DISPOSABLE) ×3 IMPLANT
STAPLER VISISTAT 35W (STAPLE) IMPLANT
STOCKINETTE IMPERVIOUS LG (DRAPES) IMPLANT
STRIP CLOSURE SKIN 1/2X4 (GAUZE/BANDAGES/DRESSINGS) IMPLANT
SUCTION FRAZIER HANDLE 10FR (MISCELLANEOUS) ×1
SUCTION TUBE FRAZIER 10FR DISP (MISCELLANEOUS) ×2 IMPLANT
SUT ETHILON 2 0 FS 18 (SUTURE) ×6 IMPLANT
SUT ETHILON 3 0 PS 1 (SUTURE) ×6 IMPLANT
SUT PDS AB 0 CT1 27 (SUTURE) ×3 IMPLANT
SUT PDS AB 2-0 CT1 27 (SUTURE) ×3 IMPLANT
SUT PDS AB 2-0 CT2 27 (SUTURE) ×3 IMPLANT
SUT VIC AB 0 CT1 27 (SUTURE) ×1
SUT VIC AB 0 CT1 27XBRD ANBCTR (SUTURE) ×2 IMPLANT
SUT VIC AB 2-0 CT1 27 (SUTURE) ×3
SUT VIC AB 2-0 CT1 TAPERPNT 27 (SUTURE) ×6 IMPLANT
TOWEL GREEN STERILE (TOWEL DISPOSABLE) ×6 IMPLANT
TOWEL GREEN STERILE FF (TOWEL DISPOSABLE) ×6 IMPLANT
TUBE CONNECTING 12X1/4 (SUCTIONS) ×3 IMPLANT
UNDERPAD 30X36 HEAVY ABSORB (UNDERPADS AND DIAPERS) ×3 IMPLANT
WATER STERILE IRR 1000ML POUR (IV SOLUTION) ×3 IMPLANT

## 2021-01-06 NOTE — Brief Op Note (Signed)
#  [id number] 

## 2021-01-06 NOTE — Transfer of Care (Signed)
Immediate Anesthesia Transfer of Care Note  Patient: Margaret Washington  Procedure(s) Performed: OPEN REDUCTION INTERNAL FIXATION (ORIF) ANKLE FRACTURE WITH RIA INTRAMEDULLARY GRAFT (Right Ankle) REMOVAL EXTERNAL FIXATION LEG (Right )  Patient Location: PACU  Anesthesia Type:GA combined with regional for post-op pain  Level of Consciousness: awake, alert , drowsy and patient cooperative  Airway & Oxygen Therapy: Patient Spontanous Breathing  Post-op Assessment: Report given to RN, Post -op Vital signs reviewed and stable and Patient moving all extremities  Post vital signs: Reviewed and stable  Last Vitals:  Vitals Value Taken Time  BP 112/60 01/06/21 1856  Temp    Pulse 80 01/06/21 1900  Resp 15 01/06/21 1900  SpO2 100 % 01/06/21 1900  Vitals shown include unvalidated device data.  Last Pain:  Vitals:   01/06/21 1500  TempSrc:   PainSc: 0-No pain      Patients Stated Pain Goal: 3 (20/25/42 7062)  Complications: No complications documented.

## 2021-01-06 NOTE — Anesthesia Postprocedure Evaluation (Signed)
Anesthesia Post Note  Patient: Margaret Washington  Procedure(s) Performed: OPEN REDUCTION INTERNAL FIXATION (ORIF) ANKLE FRACTURE WITH RIA INTRAMEDULLARY GRAFT (Right Ankle) REMOVAL EXTERNAL FIXATION LEG (Right )     Patient location during evaluation: PACU Anesthesia Type: General Level of consciousness: awake and alert Pain management: pain level controlled Vital Signs Assessment: post-procedure vital signs reviewed and stable Respiratory status: spontaneous breathing, nonlabored ventilation and respiratory function stable Cardiovascular status: blood pressure returned to baseline and stable Postop Assessment: no apparent nausea or vomiting Anesthetic complications: no   No complications documented.  Last Vitals:  Vitals:   01/06/21 1855 01/06/21 1910  BP: 112/60 113/81  Pulse: 89 87  Resp: 17 16  Temp: (!) 36.1 C   SpO2: 100% 100%    Last Pain:  Vitals:   01/06/21 1910  TempSrc:   PainSc: 0-No pain                 Audry Pili

## 2021-01-06 NOTE — Anesthesia Procedure Notes (Addendum)
Anesthesia Regional Block: Adductor canal block   Pre-Anesthetic Checklist: ,, timeout performed, Correct Patient, Correct Site, Correct Laterality, Correct Procedure, Correct Position, site marked, Risks and benefits discussed, pre-op evaluation,  At surgeon's request and post-op pain management  Laterality: Right  Prep: Maximum Sterile Barrier Precautions used, chloraprep       Needles:  Injection technique: Single-shot  Needle Type: Echogenic Stimulator Needle     Needle Length: 9cm  Needle Gauge: 21     Additional Needles:   Procedures:,,,, ultrasound used (permanent image in chart),,,,  Narrative:  Start time: 01/06/2021 2:50 PM End time: 01/06/2021 2:55 PM Injection made incrementally with aspirations every 5 mL.  Performed by: Personally  Anesthesiologist: Roberts Gaudy, MD  Additional Notes: 20 cc 0.75% Ropivacaine

## 2021-01-06 NOTE — Anesthesia Procedure Notes (Signed)
Procedure Name: Intubation Date/Time: 01/06/2021 3:26 PM Performed by: Thelma Comp, CRNA Pre-anesthesia Checklist: Patient identified, Emergency Drugs available, Suction available and Patient being monitored Patient Re-evaluated:Patient Re-evaluated prior to induction Oxygen Delivery Method: Circle System Utilized Preoxygenation: Pre-oxygenation with 100% oxygen Induction Type: IV induction Ventilation: Mask ventilation without difficulty Laryngoscope Size: 3 and 4 Grade View: Grade I Tube type: Oral Tube size: 7.0 mm Number of attempts: 1 Airway Equipment and Method: Stylet Placement Confirmation: ETT inserted through vocal cords under direct vision,  positive ETCO2 and breath sounds checked- equal and bilateral Secured at: 21 cm Tube secured with: Tape Dental Injury: Teeth and Oropharynx as per pre-operative assessment

## 2021-01-06 NOTE — Progress Notes (Signed)
I discussed with the patient the risks and benefits of surgery today for her right ankle, including the possibility of infection, nerve injury, vessel injury, wound breakdown, arthritis, symptomatic hardware, DVT/ PE, loss of motion, malunion, nonunion, and need for further surgery among others.  We also specifically discussed the need to bone graft her large defect in the tibia and the risks and benefits of reamed intramedullary grafting versus iliac crest autografting or allografting. We furthermore discussed that her bone may be too small in diameter to permit harvest with RIA. She acknowledged these risks and wished to proceed.  Margaret [place], MD [facility], [facility] [phone]
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
## 2021-01-06 NOTE — Progress Notes (Signed)
Profend in SS
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
## 2021-01-06 NOTE — Anesthesia Procedure Notes (Signed)
Anesthesia Regional Block: Popliteal block   Pre-Anesthetic Checklist: ,, timeout performed, Correct Patient, Correct Site, Correct Laterality, Correct Procedure, Correct Position, site marked, Risks and benefits discussed,  Surgical consent,  Pre-op evaluation,  At surgeon's request and post-op pain management  Laterality: Right  Prep: chloraprep       Needles:  Injection technique: Single-shot  Needle Type: Stimulator Needle - 40      Needle Gauge: 22     Additional Needles:   Procedures:, nerve stimulator,,,,,,,  Narrative:  Start time: 01/06/2021 2:55 PM End time: 01/06/2021 3:00 PM Injection made incrementally with aspirations every 5 mL.  Performed by: Personally   Additional Notes: 20 cc 0.5% Bupivacaine 1:200 epi 10 cc 1.3% Exparel

## 2021-01-07 ENCOUNTER — Encounter (HOSPITAL_COMMUNITY): Payer: Self-pay | Admitting: Orthopedic Surgery

## 2021-01-07 DIAGNOSIS — F17200 Nicotine dependence, unspecified, uncomplicated: Secondary | ICD-10-CM

## 2021-01-07 DIAGNOSIS — E559 Vitamin D deficiency, unspecified: Secondary | ICD-10-CM | POA: Diagnosis present

## 2021-01-07 HISTORY — DX: Vitamin D deficiency, unspecified: E55.9

## 2021-01-07 HISTORY — DX: Nicotine dependence, unspecified, uncomplicated: F17.200

## 2021-01-07 LAB — CBC
HCT: 27.4 % — ABNORMAL LOW (ref 36.0–46.0)
Hemoglobin: 9 g/dL — ABNORMAL LOW (ref 12.0–15.0)
MCH: 27.4 pg (ref 26.0–34.0)
MCHC: 32.8 g/dL (ref 30.0–36.0)
MCV: 83.3 fL (ref 80.0–100.0)
Platelets: 262 10*3/uL (ref 150–400)
RBC: 3.29 MIL/uL — ABNORMAL LOW (ref 3.87–5.11)
RDW: 15.4 % (ref 11.5–15.5)
WBC: 11 10*3/uL — ABNORMAL HIGH (ref 4.0–10.5)
nRBC: 0 % (ref 0.0–0.2)

## 2021-01-07 MED ORDER — VITAMIN D 25 MCG (1000 UNIT) PO TABS
2000.0000 [IU] | ORAL_TABLET | Freq: Two times a day (BID) | ORAL | Status: DC
  Administered 2021-01-07 – 2021-01-08 (×3): 2000 [IU] via ORAL
  Filled 2021-01-07 (×3): qty 2

## 2021-01-07 MED ORDER — ASCORBIC ACID 500 MG PO TABS
1000.0000 mg | ORAL_TABLET | Freq: Every day | ORAL | Status: DC
  Administered 2021-01-07 – 2021-01-08 (×2): 1000 mg via ORAL
  Filled 2021-01-07 (×2): qty 2
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
## 2021-01-07 NOTE — Plan of Care (Signed)
  Problem: Clinical Measurements: Goal: Ability to maintain clinical measurements within normal limits will improve Outcome: Progressing Goal: Will remain free from infection Outcome: Progressing   
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
## 2021-01-07 NOTE — Anesthesia Postprocedure Evaluation (Signed)
Anesthesia Post Note  Patient: Margaret Washington  Procedure(s) Performed: OPEN REDUCTION INTERNAL FIXATION (ORIF) ANKLE FRACTURE WITH RIA INTRAMEDULLARY GRAFT (Right Ankle) REMOVAL EXTERNAL FIXATION LEG (Right )     Patient location during evaluation: Nursing Unit Anesthesia Type: Regional and General Level of consciousness: awake and alert Pain management: pain level controlled Vital Signs Assessment: post-procedure vital signs reviewed and stable Respiratory status: spontaneous breathing, nonlabored ventilation, respiratory function stable and patient connected to nasal cannula oxygen Cardiovascular status: blood pressure returned to baseline and stable Postop Assessment: no apparent nausea or vomiting Anesthetic complications: no   No complications documented.  Last Vitals:  Vitals:   01/07/21 0457 01/07/21 0904  BP: 111/65 (!) 114/59  Pulse: (!) 102 96  Resp: 18 17  Temp: 37.3 C 36.8 C  SpO2: 99% 98%    Last Pain:  Vitals:   01/07/21 1908  TempSrc:   PainSc: 2                  Byron Peacock COKER

## 2021-01-08 ENCOUNTER — Other Ambulatory Visit (HOSPITAL_COMMUNITY): Payer: Self-pay

## 2021-01-08 LAB — CBC
HCT: 24.9 % — ABNORMAL LOW (ref 36.0–46.0)
Hemoglobin: 8 g/dL — ABNORMAL LOW (ref 12.0–15.0)
MCH: 27 pg (ref 26.0–34.0)
MCHC: 32.1 g/dL (ref 30.0–36.0)
MCV: 84.1 fL (ref 80.0–100.0)
Platelets: 245 10*3/uL (ref 150–400)
RBC: 2.96 MIL/uL — ABNORMAL LOW (ref 3.87–5.11)
RDW: 15.9 % — ABNORMAL HIGH (ref 11.5–15.5)
WBC: 9.4 10*3/uL (ref 4.0–10.5)
nRBC: 0 % (ref 0.0–0.2)

## 2021-01-08 MED ORDER — ACETAMINOPHEN 500 MG PO TABS
1000.0000 mg | ORAL_TABLET | Freq: Three times a day (TID) | ORAL | 2 refills | Status: AC
  Filled 2021-01-08: qty 90, 15d supply, fill #0

## 2021-01-08 MED ORDER — PREGABALIN 75 MG PO CAPS
75.0000 mg | ORAL_CAPSULE | Freq: Three times a day (TID) | ORAL | 0 refills | Status: AC
  Filled 2021-01-08: qty 90, 30d supply, fill #0

## 2021-01-08 MED ORDER — METHOCARBAMOL 500 MG PO TABS
500.0000 mg | ORAL_TABLET | Freq: Three times a day (TID) | ORAL | 0 refills | Status: AC | PRN
  Filled 2021-01-08: qty 50, 17d supply, fill #0

## 2021-01-08 MED ORDER — NALOXONE HCL 4 MG/0.1ML NA LIQD
NASAL | 0 refills | Status: AC
  Filled 2021-01-08: qty 2, 2d supply, fill #0

## 2021-01-08 MED ORDER — RIVAROXABAN 15 MG PO TABS
15.0000 mg | ORAL_TABLET | Freq: Every day | ORAL | 0 refills | Status: AC
  Filled 2021-01-08: qty 30, 30d supply, fill #0

## 2021-01-08 MED ORDER — CHOLECALCIFEROL 125 MCG (5000 UT) PO TABS
5000.0000 [IU] | ORAL_TABLET | Freq: Every day | ORAL | 6 refills | Status: AC
  Filled 2021-01-08: qty 30, 30d supply, fill #0

## 2021-01-08 MED ORDER — DOCUSATE SODIUM 100 MG PO CAPS
100.0000 mg | ORAL_CAPSULE | Freq: Two times a day (BID) | ORAL | 0 refills | Status: AC
  Filled 2021-01-08: qty 10, 5d supply, fill #0

## 2021-01-08 MED ORDER — VITAMIN D (ERGOCALCIFEROL) 1.25 MG (50000 UNIT) PO CAPS
50000.0000 [IU] | ORAL_CAPSULE | ORAL | 0 refills | Status: AC
  Filled 2021-01-08: qty 8, 56d supply, fill #0

## 2021-01-08 MED ORDER — HYDROMORPHONE HCL 4 MG PO TABS
4.0000 mg | ORAL_TABLET | Freq: Four times a day (QID) | ORAL | 0 refills | Status: AC | PRN
  Filled 2021-01-08: qty 42, 7d supply, fill #0

## 2021-01-08 MED ORDER — ASCORBIC ACID 500 MG PO TABS
1000.0000 mg | ORAL_TABLET | Freq: Every day | ORAL | 0 refills | Status: AC
  Filled 2021-01-08: qty 120, 60d supply, fill #0
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
## 2021-01-08 NOTE — Care Management (Signed)
PT recommended outpatient therapy. Per MD Notes patient is non weight bearing for 8 weeks. She will follow up with Ortho in 10-14 days. They will address therapy needs at that time.   Ricki Miller, RN BSN Case Manager
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
## 2021-01-08 NOTE — Discharge Instructions (Signed)
Orthopaedic Trauma Service Discharge Instructions   General Discharge Instructions  Orthopaedic Injuries:  Right distal tibia and fibula fracture treated with open reduction and internal fixation  WEIGHT BEARING STATUS: Nonweightbearing right leg.  Use crutches or walker to mobilize  RANGE OF MOTION/ACTIVITY: No ankle motion as you are splinted.  Do not remove splint.  Okay to move toes and knee.  Activity as tolerated while maintaining weightbearing restrictions  Bone health: Labs show vitamin D deficiency.  Please take vitamin D and vitamin C supplements that have been prescribed to you  Wound Care: Do not remove splint.  Keep splint clean and dry.  Call office if there are any concerns  DVT/PE prophylaxis: Xarelto 1 tablet daily x 30 days  Diet: as you were eating previously.  Can use over the counter stool softeners and bowel preparations, such as Miralax, to help with bowel movements.  Narcotics can be constipating.  Be sure to drink plenty of fluids  PAIN MEDICATION USE AND EXPECTATIONS  You have likely been given narcotic medications to help control your pain.  After a traumatic event that results in an fracture (broken bone) with or without surgery, it is ok to use narcotic pain medications to help control one's pain.  We understand that everyone responds to pain differently and each individual patient will be evaluated on a regular basis for the continued need for narcotic medications. Ideally, narcotic medication use should last no more than 6-8 weeks (coinciding with fracture healing).   As a patient it is your responsibility as well to monitor narcotic medication use and report the amount and frequency you use these medications when you come to your office visit.   We would also advise that if you are using narcotic medications, you should take a dose prior to therapy to maximize you participation.  IF YOU ARE ON NARCOTIC MEDICATIONS IT IS NOT PERMISSIBLE TO OPERATE A MOTOR  VEHICLE (MOTORCYCLE/CAR/TRUCK/MOPED) OR HEAVY MACHINERY DO NOT MIX NARCOTICS WITH OTHER CNS (CENTRAL NERVOUS SYSTEM) DEPRESSANTS SUCH AS ALCOHOL   STOP SMOKING OR USING NICOTINE PRODUCTS!!!!  As discussed nicotine severely impairs your body's ability to heal surgical and traumatic wounds but also impairs bone healing.  Wounds and bone heal by forming microscopic blood vessels (angiogenesis) and nicotine is a vasoconstrictor (essentially, shrinks blood vessels).  Therefore, if vasoconstriction occurs to these microscopic blood vessels they essentially disappear and are unable to deliver necessary nutrients to the healing tissue.  This is one modifiable factor that you can do to dramatically increase your chances of healing your injury.    (This means no smoking, no nicotine gum, patches, etc)  DO NOT USE NONSTEROIDAL ANTI-INFLAMMATORY DRUGS (NSAID'S)  Using products such as Advil (ibuprofen), Aleve (naproxen), Motrin (ibuprofen) for additional pain control during fracture healing can delay and/or prevent the healing response.  If you would like to take over the counter (OTC) medication, Tylenol (acetaminophen) is ok.  However, some narcotic medications that are given for pain control contain acetaminophen as well. Therefore, you should not exceed more than 4000 mg of tylenol in a day if you do not have liver disease.  Also note that there are may OTC medicines, such as cold medicines and allergy medicines that my contain tylenol as well.  If you have any questions about medications and/or interactions please ask your doctor/PA or your pharmacist.      ICE AND ELEVATE INJURED/OPERATIVE EXTREMITY  Using ice and elevating the injured extremity above your heart can help with  swelling and pain control.  Icing in a pulsatile fashion, such as 20 minutes on and 20 minutes off, can be followed.    Do not place ice directly on skin. Make sure there is a barrier between to skin and the ice pack.    Using frozen  items such as frozen peas works well as the conform nicely to the are that needs to be iced.  USE AN ACE WRAP OR TED HOSE FOR SWELLING CONTROL  In addition to icing and elevation, Ace wraps or TED hose are used to help limit and resolve swelling.  It is recommended to use Ace wraps or TED hose until you are informed to stop.    When using Ace Wraps start the wrapping distally (farthest away from the body) and wrap proximally (closer to the body)   Example: If you had surgery on your leg or thing and you do not have a splint on, start the ace wrap at the toes and work your way up to the thigh        If you had surgery on your upper extremity and do not have a splint on, start the ace wrap at your fingers and work your way up to the upper arm  IF YOU ARE IN A SPLINT OR CAST DO NOT [place]   If your splint gets wet for any reason please contact the office immediately. You may shower in your splint or cast as long as you keep it dry.  This can be done by wrapping in a cast cover or garbage back (or similar)  Do Not stick any thing down your splint or cast such as pencils, money, or hangers to try and scratch yourself with.  If you feel itchy take benadryl as prescribed on the bottle for itching  IF YOU ARE IN A CAM BOOT (BLACK BOOT)  You may remove boot periodically. Perform daily dressing changes as noted below.  Wash the liner of the boot regularly and wear a sock when wearing the boot. It is recommended that you sleep in the boot until told otherwise    Call office for the following:  Temperature greater than 101F  Persistent nausea and vomiting  Severe uncontrolled pain  Redness, tenderness, or signs of infection (pain, swelling, redness, odor or green/yellow discharge around the site)  Difficulty breathing, headache or visual disturbances  Hives  Persistent dizziness or light-headedness  Extreme fatigue  Any other questions or concerns you may have after  discharge  In an emergency, call 911 or go to an Emergency Department at a nearby hospital  HELPFUL INFORMATION  ? If you had a block, it will wear off between 8-24 hrs postop typically.  This is period when your pain may go from nearly zero to the pain you would have had postop without the block.  This is an abrupt transition but nothing dangerous is happening.  You may take an extra dose of narcotic when this happens.  ? You should wean off your narcotic medicines as soon as you are able.  Most patients will be off or using minimal narcotics before their first postop appointment.   ? We suggest you use the pain medication the first night prior to going to bed, in order to ease any pain when the anesthesia wears off. You should avoid taking pain medications on an empty stomach as it will make you nauseous.  ? Do not drink alcoholic beverages or take illicit drugs when  taking pain medications.  ? In most states it is against the law to drive while you are in a splint or sling.  And certainly against the law to drive while taking narcotics.  ? You may return to work/school in the next couple of days when you feel up to it.   ? Pain medication may make you constipated.  Below are a few solutions to try in this order: - Decrease the amount of pain medication if you aren't having pain. - Drink lots of decaffeinated fluids. - Drink prune juice and/or each dried prunes  o If the first 3 don't work start with additional solutions - Take Colace - an over-the-counter stool softener - Take Senokot - an over-the-counter laxative - Take Miralax - a stronger over-the-counter laxative     CALL THE OFFICE WITH ANY QUESTIONS OR CONCERNS: [phone]   VISIT OUR WEBSITE FOR ADDITIONAL INFORMATION: [email or web address]     Cast or Splint Care, Adult Casts and splints are supports that are worn to protect broken bones and other injuries. A cast or splint may hold a bone still and in the correct  position while it heals. Casts and splints may also help to ease pain, swelling, and muscle spasms. A cast is a hardened support that is usually made of fiberglass or plaster. It is custom-fit to the body and offers more protection than a splint. Most casts cannot be taken off and put back on. A splint is a type of soft support that is usually made from cloth and elastic. It can be adjusted or taken off as needed. Often, splints are used on broken bones at first. Later, a cast can replace the splint after the swelling goes down. What are the risks? In some cases, wearing a cast or splint can cause a reduced blood supply to the wrist or hand or to the foot and toes. This can happen if there is a lot of swelling or if the cast or splint is too tight. Limited blood supply can cause a problem called compartment syndrome. This can lead to lasting damage. Symptoms include:  Pain that is getting worse.  Numbness and tingling.  Changes in skin color, including paleness or a bluish color.  Cold fingers or toes. Other problems from wearing a cast or splint can include:  Skin irritation that can cause: ? Itching. ? Rash. ? Skin sores. ? Skin infection.  Limb stiffness or weakness. How to care for your cast  Check the skin around it every day. Tell your doctor about any concerns.  Do not stick anything inside it to scratch your skin.  You may put lotion on dry skin around the edges of the cast. Do not put lotion on the skin underneath it.  Keep it clean and dry.   How to care for your splint  Wear the splint as told by your doctor. Take it off only as told by your doctor.  Check the skin around it every day. Tell your doctor about any concerns.  Loosen it if your fingers or toes tingle, get numb, or turn cold and blue.  Keep it clean and dry. Clean your splint as told by your doctor. Use mild soap and water and let it air-dry. Do not use heat on the splint. Follow these instructions at  home: Bathing  Do not take baths, swim, or use a hot tub until your doctor approves. Ask your doctor if you may take showers. You may only be allowed to take  sponge baths.  If the cast or splint is not waterproof: ? Do not let it get wet. ? Cover it with a watertight covering when you take a bath or a shower. Managing pain, stiffness, and swelling  If told, put ice on the affected area. To do this: ? If you have a cast or splint that can be taken off, take it off as told by your doctor. ? Put ice in a plastic bag. ? Place a towel between your skin and the bag or between your cast and the bag. ? Leave the ice on for 20 minutes, 2-3 times a day.  Move your fingers or toes often.  Raise (elevate) the injured area above the level of your heart while you are sitting or lying down.   Safety  Do not use your injured leg or foot to support your body weight until your doctor says that you can.  Use crutches or other helpful (assistive) devices as told by your doctor.  Ask your doctor when it is safe to drive if you have a cast or splint on part of your body. General instructions  Do not put pressure on any part of the cast or splint until it is fully hardened. This may take many hours.  Take over-the-counter and prescription medicines only as told by your doctor.  Do not use any products that contain nicotine or tobacco, such as cigarettes, e-cigarettes, and chewing tobacco. These can delay healing. If you need help quitting, ask your doctor.  Return to your normal activities as told by your doctor. Ask your doctor what activities are safe for you.  Keep all follow-up visits as told by your doctor. This is important. Contact a doctor if:  The skin around the cast or splint gets red or raw.  The skin under the cast is very itchy or painful.  Your cast or splint: ? Gets damaged. ? Feels very uncomfortable. ? Is too tight or too loose.  Your cast becomes wet or it starts to have a  soft spot or area.  There is a bad smell coming from under your cast.  You get an object stuck under your cast. Get help right away if:  You get any symptoms of compartment syndrome, such as: ? Very bad pain or pressure under the cast. ? Numbness, tingling, coldness, or pale or bluish skin.  The part of your body above or below the cast is swollen, and it turns a different color (is discolored).  You cannot feel or move your fingers or toes.  Your pain gets worse.  There is fluid leaking through the cast.  You have trouble breathing or shortness of breath.  You have chest pain. Summary  Casts and splints are worn to protect broken bones and other injuries.  Most casts cannot be taken off, and most splints can be taken off.  Keep your cast or splint clean and dry.  Take off your cast or splint only as told by your doctor.  Get help right away if you have very bad pain, numbness, tingling, or skin that turns cold or another color. This information is not intended to replace advice given to you by your health care provider. Make sure you discuss any questions you have with your health care provider. Document Revised: 05/10/2019 Document Reviewed: 05/10/2019 Elsevier Patient Education  2021 [employer].
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
## 2021-01-08 NOTE — Plan of Care (Signed)
  Problem: Education: Goal: Knowledge of General Education information will improve Description: Including pain rating scale, medication(s)/side effects and non-pharmacologic comfort measures Outcome: Adequate for Discharge   Problem: Health Behavior/Discharge Planning: Goal: Ability to manage health-related needs will improve Outcome: Adequate for Discharge   Problem: Clinical Measurements: Goal: Ability to maintain clinical measurements within normal limits will improve Outcome: Adequate for Discharge Goal: Will remain free from infection Outcome: Adequate for Discharge Goal: Diagnostic test results will improve Outcome: Adequate for Discharge Goal: Respiratory complications will improve Outcome: Adequate for Discharge Goal: Cardiovascular complication will be avoided Outcome: Adequate for Discharge   Problem: Activity: Goal: Risk for activity intolerance will decrease Outcome: Adequate for Discharge   Problem: Nutrition: Goal: Adequate nutrition will be maintained Outcome: Adequate for Discharge   Problem: Coping: Goal: Level of anxiety will decrease Outcome: Adequate for Discharge   Problem: Elimination: Goal: Will not experience complications related to bowel motility Outcome: Adequate for Discharge Goal: Will not experience complications related to urinary retention Outcome: Adequate for Discharge   Problem: Pain Managment: Goal: General experience of comfort will improve Outcome: Adequate for Discharge   Problem: Safety: Goal: Ability to remain free from injury will improve Outcome: Adequate for Discharge   Problem: Skin Integrity: Goal: Risk for impaired skin integrity will decrease Outcome: Adequate for Discharge   Problem: Acute Rehab OT Goals (only OT should resolve) Goal: Pt. Will Perform Lower Body Dressing Outcome: Adequate for Discharge Goal: Pt. Will Transfer To Toilet Outcome: Adequate for Discharge Goal: Pt. Will Perform Toileting-Clothing  Manipulation Outcome: Adequate for Discharge Goal: Pt. Will Perform Tub/Shower Transfer Outcome: Adequate for Discharge

## 2021-01-08 NOTE — Progress Notes (Signed)
Pt was given her AVS discharge summary and went over with her by [facility] nurse. IV was removed with catheter intact. Waiting on [facility] pharmacy for meds. DME 3in1 and walker equipment in room to take home with her. Pt had no further questions.

## 2021-01-08 NOTE — Progress Notes (Signed)
Occupational Therapy Treatment Patient Details Name: Margaret Washington MRN: [id number] DOB: Jun 04, 1976 Today's Date: 01/08/2021    History of present illness Patient is a 45 year old female who was in [facility] who sustained an open right distal tibia and fibula fracture, pt placed in ex-fx with plan to go to OR on 5/3 for ORIF. Pt NWB. PMH: ovarian cyst, chronic back pain, addiction to pain meds. Pt underwent R ankle ORIF on 5/3.   OT comments  Pt pleasant and eager for therapy with plans to dc home today, pt boyfriend present this session and supportive throughout. Pt was set up/min guard for all ADLs, and demonstrated good ability to use compensatory techniques for ADLs. Educated pt of tub transfer with use of 3 in 1 to incrase safety once MD clears her for showers, pt and boyfriends verbalized understanding.  Pt was min guard with rw with all transfers and ambulation within the room. Pt would benefit from continued OT services acutely to maximize function and safety in ADLs and mobility. D/c plan remains appropriate.     Follow Up Recommendations  No OT follow up    Equipment Recommendations  3 in 1 bedside commode;Other (comment) (rw)       Precautions / Restrictions Precautions Precautions: Fall Required Braces or Orthoses: Splint/Cast (wtih ace wrap) Restrictions Weight Bearing Restrictions: Yes RLE Weight Bearing: Non weight bearing       Mobility Bed Mobility Overal bed mobility: Needs Assistance Bed Mobility: Supine to Sit     Supine to sit: Supervision;HOB elevated     General bed mobility comments: pt able to manage R LE on own    Transfers Overall transfer level: Needs assistance Equipment used: Rolling walker (2 wheeled) Transfers: Sit to/from Stand Sit to Stand: Min guard              Balance Overall balance assessment: Needs assistance Sitting-balance support: No upper extremity supported Sitting balance-Leahy Scale: Good     Standing balance support:  Single extremity supported;During functional activity Standing balance-Leahy Scale: Fair Standing balance comment: dependent on RW for safe standing        ADL either performed or assessed with clinical judgement   ADL Overall ADL's : Needs assistance/impaired     Grooming: Wash/dry hands;Wash/dry face;Oral care;Supervision/safety;Standing (at sink)           Upper Body Dressing : Set up;Sitting   Lower Body Dressing: Min guard;Sit to/from stand (with rw)               Functional mobility during ADLs: Min guard;Cueing for safety;Rolling walker General ADL Comments: A levels for safety, pt boyfriend present and supportive      Cognition Arousal/Alertness: Awake/alert Behavior During Therapy: WFL for tasks assessed/performed Overall Cognitive Status: Within Functional Limits for tasks assessed                       General Comments cast and ace wrap in tact, no appretn skin integrity issues noted    Pertinent Vitals/ Pain       Pain Assessment: Faces Faces Pain Scale: Hurts a little bit Pain Location: R foot Pain Descriptors / Indicators: Grimacing;Guarding Pain Intervention(s): Monitored during session         Frequency  Min 2X/week        Progress Toward Goals  OT Goals(current goals can now be found in the care plan section)  Progress towards OT goals: Progressing toward goals  Acute Rehab OT Goals Patient Stated  Goal: to go home OT Goal Formulation: With patient Time For Goal Achievement: 01/19/21 Potential to Achieve Goals: Good ADL Goals Pt Will Perform Lower Body Dressing: with supervision;sit to/from stand Pt Will Transfer to Toilet: with supervision;ambulating Pt Will Perform Toileting - Clothing Manipulation and hygiene: with supervision;sit to/from stand Pt Will Perform Tub/Shower Transfer: with supervision;ambulating;tub bench;rolling walker  Plan Discharge plan remains appropriate       AM-PAC OT "6 Clicks" Daily Activity      Outcome Measure   Help from another person eating meals?: None Help from another person taking care of personal grooming?: A Little Help from another person toileting, which includes using toliet, bedpan, or urinal?: A Little Help from another person bathing (including washing, rinsing, drying)?: A Little Help from another person to put on and taking off regular upper body clothing?: A Little Help from another person to put on and taking off regular lower body clothing?: A Little 6 Click Score: 19    End of Session Equipment Utilized During Treatment: Rolling walker  OT Visit Diagnosis: Unsteadiness on feet (R26.81);Other abnormalities of gait and mobility (R26.89)   Activity Tolerance Patient tolerated treatment well   Patient Left in bed;with call bell/phone within reach;with family/visitor present   Nurse Communication Mobility status        Time: 2836-6294 OT Time Calculation (min): 12 min  Charges: OT General Charges $OT Visit: 1 Visit OT Treatments $Self Care/Home Management : 8-22 mins     [name] 01/08/2021, 1:29 PM

## 2021-01-09 NOTE — H&P (Signed)
   H&P note done on 01/03/2021 Labeled as progress note  Jari Pigg, PA-C [phone] (C) 01/09/2021, 11:24 AM  [facility] [place] [id number] [phone] (F)

## 2021-01-12 ENCOUNTER — Other Ambulatory Visit (HOSPITAL_COMMUNITY): Payer: Self-pay

## 2021-01-12 ENCOUNTER — Telehealth: Payer: Self-pay | Admitting: Pharmacist

## 2021-01-12 NOTE — Telephone Encounter (Signed)
Pharmacy Transitions of Care Follow-up Telephone Call  Date of discharge: 01/08/21  Discharge Diagnosis: tibia fracture  How have you been since you were released from the hospital?   Medication changes made at discharge:  - START: Xarelto 15mg  Once daily with supper  Medication changes verified by the patient? yes    Medication Accessibility:  Home Pharmacy: [facility]  Was the patient provided with refills on discharged medications? no   Is the patient able to afford medications? yes   Medication Review:  RIVAROXABAN (XARELTO)  Rivaroxaban 15 mg Once daily with supper for 30 days - Discussed importance of taking medication with food and around the same time everyday  - Reviewed potential DDIs with patient  - Advised patient of medications to avoid (NSAIDs, ASA)  - Educated that Tylenol (acetaminophen) will be the preferred analgesic to prevent risk of bleeding  - Emphasized importance of monitoring for signs and symptoms of bleeding (abnormal bruising, prolonged bleeding, nose bleeds, bleeding from gums, discolored urine, black tarry stools)  - Advised patient to alert all providers of anticoagulation therapy prior to starting a new medication or having a procedure     Follow-up Appointments:     [facility] f/u appt confirmed? yes  If their condition worsens, is the pt aware to call PCP or go to the Emergency Dept.? yes  Final Patient Assessment:

## 2021-01-13 ENCOUNTER — Encounter (HOSPITAL_COMMUNITY): Payer: Self-pay | Admitting: Orthopedic Surgery

## 2021-01-14 ENCOUNTER — Other Ambulatory Visit (HOSPITAL_COMMUNITY): Payer: Self-pay

## 2022-05-24 ENCOUNTER — Ambulatory Visit
Admission: EM | Admit: 2022-05-24 | Discharge: 2022-05-24 | Disposition: A | Discharge status: Home / Self Care | Payer: Medicaid Other | Attending: Emergency Medicine | Admitting: Emergency Medicine

## 2022-05-24 DIAGNOSIS — K120 Recurrent oral aphthae: Secondary | ICD-10-CM

## 2022-05-24 MED ORDER — LIDOCAINE VISCOUS HCL 2 % MT SOLN: 5.0000 mL | Freq: Three times a day (TID) | OROMUCOSAL | 0 refills | Status: AC | PRN

## 2022-05-24 NOTE — ED Triage Notes (Signed)
Patient to Urgent Care with complaints of a sore present to end of tongue x5 days. Has been using orajel with minimal relief.
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
## 2022-05-24 NOTE — Discharge Instructions (Addendum)
Use the Magic mouthwash as directed.  Follow up with your primary care provider if your symptoms are not improving.
